# Patient Record
Sex: Female | Born: 1960 | Race: White | Hispanic: No | Marital: Married | Smoking: Current every day smoker
Health system: Southern US, Community
[De-identification: ages and names within clinical notes are randomized; demographics above are authoritative.]

## PROBLEM LIST (undated history)

## (undated) DIAGNOSIS — F32A Depression, unspecified: Secondary | ICD-10-CM

## (undated) DIAGNOSIS — I10 Essential (primary) hypertension: Secondary | ICD-10-CM

## (undated) DIAGNOSIS — E7800 Pure hypercholesterolemia, unspecified: Secondary | ICD-10-CM

## (undated) DIAGNOSIS — K7581 Nonalcoholic steatohepatitis (NASH): Secondary | ICD-10-CM

## (undated) DIAGNOSIS — M4300 Spondylolysis, site unspecified: Secondary | ICD-10-CM

## (undated) DIAGNOSIS — M5020 Other cervical disc displacement, unspecified cervical region: Secondary | ICD-10-CM

## (undated) DIAGNOSIS — I2510 Atherosclerotic heart disease of native coronary artery without angina pectoris: Secondary | ICD-10-CM

## (undated) DIAGNOSIS — M5126 Other intervertebral disc displacement, lumbar region: Secondary | ICD-10-CM

## (undated) DIAGNOSIS — F329 Major depressive disorder, single episode, unspecified: Secondary | ICD-10-CM

## (undated) HISTORY — PX: CHOLECYSTECTOMY: SHX55

## (undated) HISTORY — DX: Depression, unspecified: F32.A

## (undated) HISTORY — DX: Pure hypercholesterolemia, unspecified: E78.00

---

## 1997-04-24 ENCOUNTER — Emergency Department: Admit: 1997-04-24 | Payer: Self-pay | Admitting: Family Medicine

## 2002-05-04 ENCOUNTER — Ambulatory Visit: Admit: 2002-05-04 | Disposition: A | Discharge status: Home / Self Care | Payer: Self-pay | Source: Ambulatory Visit

## 2002-05-05 ENCOUNTER — Ambulatory Visit
Admit: 2002-05-05 | Disposition: A | Discharge status: Home / Self Care | Payer: Self-pay | Source: Ambulatory Visit | Admitting: Cardiovascular Disease

## 2008-08-28 ENCOUNTER — Emergency Department: Admit: 2008-08-28 | Payer: Self-pay | Source: Emergency Department | Admitting: Internal Medicine

## 2011-08-20 ENCOUNTER — Emergency Department: Payer: PRIVATE HEALTH INSURANCE

## 2011-08-20 ENCOUNTER — Emergency Department
Admission: EM | Admit: 2011-08-20 | Discharge: 2011-08-20 | Discharge status: Against Medical Advice | Payer: PRIVATE HEALTH INSURANCE | Attending: Emergency Medicine | Admitting: Emergency Medicine

## 2011-08-20 DIAGNOSIS — I10 Essential (primary) hypertension: Secondary | ICD-10-CM | POA: Insufficient documentation

## 2011-08-20 DIAGNOSIS — R079 Chest pain, unspecified: Secondary | ICD-10-CM

## 2011-08-20 DIAGNOSIS — R0789 Other chest pain: Secondary | ICD-10-CM | POA: Insufficient documentation

## 2011-08-20 HISTORY — DX: Essential (primary) hypertension: I10

## 2011-08-20 LAB — CBC AND DIFFERENTIAL
Basophils Absolute Automated: 0.04 10*3/uL (ref 0.00–0.20)
Basophils Automated: 0 % (ref 0–2)
Eosinophils Absolute Automated: 0.17 10*3/uL (ref 0.00–0.70)
Eosinophils Automated: 2 % (ref 0–5)
Hematocrit: 41.7 % (ref 37.0–47.0)
Hgb: 14.4 g/dL (ref 12.0–16.0)
Lymphocytes Absolute Automated: 3.14 10*3/uL (ref 0.50–4.40)
Lymphocytes Automated: 40 % (ref 15–41)
MCH: 34 pg — ABNORMAL HIGH (ref 28.0–32.0)
MCHC: 34.5 g/dL (ref 32.0–36.0)
MCV: 98.6 fL (ref 80.0–100.0)
MPV: 9 fL — ABNORMAL LOW (ref 9.4–12.3)
Monocytes Absolute Automated: 0.74 10*3/uL (ref 0.00–1.20)
Monocytes: 10 % (ref 0–11)
Neutrophils Absolute: 3.72 10*3/uL (ref 1.80–8.10)
Neutrophils: 48 % — ABNORMAL LOW (ref 52–75)
Platelets: 321 10*3/uL (ref 140–400)
RBC: 4.23 10*6/uL (ref 4.20–5.40)
RDW: 12 % (ref 12–15)
WBC: 7.81 10*3/uL (ref 3.50–10.80)

## 2011-08-20 LAB — HEPATIC FUNCTION PANEL
ALT: 35 U/L (ref 4–36)
AST (SGOT): 33 U/L (ref 10–41)
Albumin/Globulin Ratio: 1.5 (ref 1.1–1.8)
Albumin: 4.5 g/dL (ref 3.4–4.9)
Alkaline Phosphatase: 87 U/L (ref 43–112)
Bilirubin Direct: 0 mg/dL (ref 0.0–0.3)
Bilirubin Indirect: 0.1 mg/dL (ref 0.0–1.1)
Bilirubin, Total: 0.5 mg/dL (ref 0.2–1.0)
Globulin: 3.1 g/dL (ref 2.0–3.7)
Protein, Total: 7.6 g/dL (ref 6.0–8.0)

## 2011-08-20 LAB — BASIC METABOLIC PANEL
BUN: 10 mg/dL (ref 7–21)
CO2: 24 mEq/L (ref 22–31)
Calcium: 9.5 mg/dL (ref 8.6–10.2)
Chloride: 107 mEq/L (ref 98–107)
Creatinine: 0.6 mg/dL (ref 0.5–1.4)
Glucose: 92 mg/dL (ref 70–100)
Potassium: 4 mEq/L (ref 3.6–5.0)
Sodium: 143 mEq/L (ref 136–143)

## 2011-08-20 LAB — TROPONIN I: Troponin I: <0.05 ng/mL (ref 0.00–0.30)

## 2011-08-20 LAB — GFR: EGFR: >60

## 2011-08-20 MED ORDER — IOHEXOL 350 MG/ML IV SOLN
100.00 mL | Freq: Once | INTRAVENOUS | Status: AC
Start: 2011-08-20 — End: 2011-08-20
  Administered 2011-08-20: 120 mL via INTRAVENOUS

## 2011-08-20 MED ORDER — ASPIRIN 81 MG PO CHEW
162.00 mg | CHEWABLE_TABLET | Freq: Once | ORAL | Status: AC
Start: 2011-08-20 — End: 2011-08-20
  Administered 2011-08-20: 162 mg via ORAL
  Filled 2011-08-20: qty 2

## 2011-08-20 MED ORDER — NITROGLYCERIN 0.4 MG SL SUBL
0.4000 mg | SUBLINGUAL_TABLET | SUBLINGUAL | Status: DC | PRN
Start: 2011-08-20 — End: 2011-08-20
  Administered 2011-08-20 (×2): 0.4 mg via SUBLINGUAL
  Filled 2011-08-20: qty 1

## 2011-08-20 NOTE — Discharge Instructions (Signed)
Return to ER for further chest pain, trouble breathing, if willing to have more treatment/evaluation or other concerning symptoms.    Follow-up with a primary doctor and cardiologist as soon as possible (recommendations given).    Your doctor today was Dr. Bennett Scrape.  Thank you for the opportunity to care for you.

## 2011-08-20 NOTE — ED Provider Notes (Signed)
History     Chief Complaint   Patient presents with   . Chest Pain     HPI Comments: Sudden onset of severe chest pain earlier today.  Pain is been present constantly but waxing and waning, at presentation still severe.  No radiation of pain,  No movement over time.  Not worse with exertion or Deep breath.  Pain is sharp.  No associated diaphoresis, nausea, vomiting, does feel somewhat short of breath.   Patient has family history of aortic dissection (sister).  She does smoke, has high cholesterol and hypertension.    The history is provided by the patient.       Past Medical History   Diagnosis Date   . Hypertensive disorder        Past Surgical History   Procedure Date   . Cholecystectomy        Family History   Problem Relation Age of Onset   . Heart disease Mother    . Heart disease Father    . Heart disease Sister        Current Facility-Administered Medications   Medication Dose Route Frequency Provider Last Rate Last Dose   . aspirin chewable tablet 162 mg  162 mg Oral Once Pura Spice, MD   162 mg at 08/20/11 1916   . iohexol (OMNIPAQUE) 350 MG/ML injection 100 mL  100 mL Intravenous Once Pura Spice, MD   120 mL at 08/20/11 1714   . nitroglycerin (NITROSTAT) SL tablet 0.4 mg  0.4 mg Sublingual Q5 Min PRN Pura Spice, MD   0.4 mg at 08/20/11 1615     Current Outpatient Prescriptions   Medication Sig Dispense Refill   . ibuprofen (ADVIL,MOTRIN) 400 MG tablet Take 400 mg by mouth every 6 (six) hours as needed.             No Known Allergies    History   Substance Use Topics   . Smoking status: Current Some Day Smoker -- 1.0 packs/day for 35 years   . Smokeless tobacco: Not on file   . Alcohol Use: Yes      social       Review of Systems   Constitutional: Negative.    HENT: Negative for sore throat.    Eyes: Negative.    Respiratory: Positive for shortness of breath.    Cardiovascular: Positive for chest pain.   Gastrointestinal: Negative.    Genitourinary: Negative.    Musculoskeletal:  Negative for back pain.   Skin: Negative.    Neurological: Negative.    Hematological: Negative.        Physical Exam   BP 160/91  Pulse 69  Temp(Src) 99.1 F (37.3 C) (Oral)  Resp 14  Ht 1.575 m  Wt 49.896 kg  BMI 20.11 kg/m2  SpO2 99%    Physical Exam   Constitutional: She appears well-developed and well-nourished.        Uncomfortable appearing   HENT:   Mouth/Throat: Oropharynx is clear and moist.   Eyes: Conjunctivae are normal. No scleral icterus.   Cardiovascular: Normal rate, regular rhythm and normal heart sounds.         Pulses equal   Pulmonary/Chest: Effort normal and breath sounds normal. No respiratory distress.   Abdominal: Soft. There is no tenderness.        No masses   Musculoskeletal:        No back tenderness   Neurological: She is alert.  Motor grossly normal   Skin: Skin is warm and dry.   Psychiatric: She has a normal mood and affect. Her behavior is normal.       ED Course   Procedures    MDM  Number of Diagnoses or Management Options  Chest pain, unspecified:   Diagnosis management comments: DDx includes ACS, PE, dissection, PNA, PTX, pericarditis, musculoskeletal, upper abdominal pain, etc.    This case, concern for PE, Dissection, ACS still present after initial testing.    Chest x-ray: no infiltrate  Labs: negative cardiac enzymes, Normal renal function  CT chest: no evidence of PE    Patient received to nitroglycerin and became chest pain free.    In light of clinical picture, patient advised to be admitted to the hospital for further workup, but after review of risks and benefits patient declined, choosing to leave against medical advice.  Patient demonstrates capacity to make this decision.    Return precautions, supportive care, follow-up recommendations reviewed.           EKG showed: NSR at 92, slightly peaked t-waves.  Repeat EKG at conclusion of visit NSR at 89, morphology unchanged from prior.    Pura Spice, MD  08/22/11 [phone]

## 2011-08-20 NOTE — ED Notes (Addendum)
51 y.o female with L chest pain 1000 am 10/10- constant now still there 4/10 now. Radiated under L breast.had been carrying wood in house prior(but does this usually).

## 2011-08-21 LAB — ECG 12-LEAD
Atrial Rate: 92 {beats}/min
Atrial Rate: 89 {beats}/min
P Axis: 54 degrees
P Axis: 59 degrees
P-R Interval: 116 ms
P-R Interval: 120 ms
Q-T Interval: 364 ms
Q-T Interval: 376 ms
QRS Duration: 88 ms
QRS Duration: 84 ms
QTC Calculation (Bezet): 450 ms
QTC Calculation (Bezet): 457 ms
R Axis: 54 degrees
R Axis: 49 degrees
T Axis: 59 degrees
T Axis: 55 degrees
Ventricular Rate: 92 {beats}/min
Ventricular Rate: 89 {beats}/min

## 2013-01-16 ENCOUNTER — Emergency Department: Payer: PRIVATE HEALTH INSURANCE

## 2013-01-16 ENCOUNTER — Emergency Department
Admission: EM | Admit: 2013-01-16 | Discharge: 2013-01-16 | Disposition: A | Discharge status: Home / Self Care | Payer: Charity | Attending: Emergency Medicine | Admitting: Emergency Medicine

## 2013-01-16 DIAGNOSIS — F17200 Nicotine dependence, unspecified, uncomplicated: Secondary | ICD-10-CM | POA: Insufficient documentation

## 2013-01-16 DIAGNOSIS — I10 Essential (primary) hypertension: Secondary | ICD-10-CM | POA: Insufficient documentation

## 2013-01-16 DIAGNOSIS — B9789 Other viral agents as the cause of diseases classified elsewhere: Secondary | ICD-10-CM | POA: Insufficient documentation

## 2013-01-16 DIAGNOSIS — E7800 Pure hypercholesterolemia, unspecified: Secondary | ICD-10-CM | POA: Insufficient documentation

## 2013-01-16 DIAGNOSIS — J029 Acute pharyngitis, unspecified: Secondary | ICD-10-CM | POA: Insufficient documentation

## 2013-01-16 MED ORDER — NAPROXEN 250 MG PO TABS
500.0000 mg | ORAL_TABLET | Freq: Two times a day (BID) | ORAL | Status: AC
Start: 2013-01-16 — End: 2013-01-21

## 2013-01-16 MED ORDER — OXYMETAZOLINE HCL 0.05 % NA SOLN
2.0000 | Freq: Two times a day (BID) | NASAL | Status: DC
Start: 2013-01-16 — End: 2013-10-07

## 2013-01-16 NOTE — ED Notes (Signed)
Pt to ED with c/o throat pain and R ear pain x47month. Speaks clearly in complete sentences. A+0x4.

## 2013-01-16 NOTE — ED Notes (Signed)
Pt cleared for d/c to home. Pt angry and requesting antibiotics. Dr. Harvie Junior spoke with pt, informed pt based on his clinical findings of no swelling or redness in ear, and no redness or sign of infection in back of throat, abx not indicated at this time. Pt angry states she does not want her rxs for afrin and naproxen and that "they will not help." Pt stated she will go somewhere else to get antibiotics. Discharge instructions provided to pt and 2 rx's. Pt verbalized understanding. A+0x4. Ambulated out of ED with steady gait. No increased work of breathing noted.

## 2013-01-16 NOTE — Discharge Instructions (Signed)
Pharyngitis, Viral     You have been diagnosed with viral pharyngitis. This is the medical term for a sore throat.     Viral pharyngitis is an infection in the back of your throat and tonsils caused by a virus. It may look and feel like a Strep throat, but it usually occurs with other cold symptoms like a runny nose, sinus congestion, cough, fever, or muscle aches. It is sometimes hard to tell a viral sore throat from Strep throat. Your physician may do a "rapid strep test" or culture to see which kind of infection you have.     Symptoms include fever, sore throat, painful swallowing, and headache. You may also have symptoms of the common cold and notice swollen tender neck glands. You might have pus or white spots on your tonsils.     Viral pharyngitis is treated with medication for pain and fever. Antibiotics DO NOT cure viral infections and may cause side-effects, like diarrhea, nausea, abdominal cramps or allergic reactions. Using antibiotics when they are not necessary can lead to resistance, meaning antibiotics will not work when you need them in the future.     YOU SHOULD SEEK MEDICAL ATTENTION IMMEDIATELY, EITHER HERE OR AT THE NEAREST EMERGENCY DEPARTMENT, IF ANY OF THE FOLLOWING OCCURS:  · You have trouble breathing.  · Your voice changes or sounds hoarse.  · Your throat pain gets worse or you have neck pain.  · You cannot swallow liquids or medication.  · You drool or cannot swallow saliva.  · You feel worse or don’t improve after 2 to 3 days.        Decongestant Use (EDU)     A decongestant is a medicine that helps to dry up a runny nose. It relieves the stuffy feeling that usually goes along with a cold. It is also used to treat congestion caused by allergies, hay fever, or sinus irritation. Decongestants work by shrinking the blood vessels in your nose for a few hours. There are two main types of decongestants: Oral medications (the kind you swallow) and nasal sprays.     Pseudoephedrine is a common  oral decongestant that you can buy over-the-counter. Sudafed is a well-known brand of pseudoephedrine.     Phenylephrine is another common oral decongestant that you can buy over the counter. Sudafed PE is a well-known brand of oral phenylephrine.     Oral decongestants have some side-effects. They can make you dizzy or sleepy. Some people may become agitated or restless. Be careful when driving until you know how the medicine affects you. Because the medicine moves around your blood stream, it can constrict blood vessels other than the ones in your nose. This can cause your blood pressure to go up. Talk to your doctor before taking decongestants if you have any of the following:   · High blood pressure.  · Heart disease.  · Problems urinating or an enlarged prostate.  · Diabetes.  · Thyroid problems.  · Glaucoma.     If you are pregnant or breast-feeding, talk to your OB/GYN before taking any over-the-counter medicines. The American College of Obstetricians and Gynecologists (ACOG) recommends that pregnant women do not take pseudoephedrine or phenylephrine during the first trimester (weeks one to 13). These medications are generally considered safe later in pregnancy.     The American Academy of Pediatrics and the Working Group on Human Lactation say it is safe to use pseudoephedrine occasionally while breastfeeding. Phenylephrine is safe to use while breastfeeding.     If you try a decongestant medicine and your   congestion does not get better, talk to your regular doctor or a doctor who specializes in ear, nose, and throat disorders.

## 2013-01-16 NOTE — ED Provider Notes (Signed)
Physician/Midlevel provider first contact with patient: 01/16/13 1938         History     Chief Complaint   Patient presents with   . Sore Throat     right ear pain        Kari Hebert is a 52 y.o. female who p/w sore throat and R ear pain for 1 month. Pt has tried over the counter remedies with no relief to symptoms. Her daughter also has similar concerns. She denies any other concerns. No fever. No cough.    PCP: Christa See, MD           Past Medical History   Diagnosis Date   . Hypertensive disorder    . Hypercholesteremia        Past Surgical History   Procedure Date   . Cholecystectomy        Family History   Problem Relation Age of Onset   . Heart disease Mother    . Heart disease Father    . Heart disease Sister        Social  History   Substance Use Topics   . Smoking status: Current Some Day Smoker -- 1.0 packs/day for 35 years   . Smokeless tobacco: Not on file   . Alcohol Use: Yes      Comment: social       .     No Known Allergies    Current/Home Medications    IBUPROFEN (ADVIL,MOTRIN) 400 MG TABLET    Take 400 mg by mouth every 6 (six) hours as needed.          Review of Systems   Constitutional: Negative for fever.   All other systems reviewed and are negative.        Physical Exam    BP 106/96  Pulse 83  Temp 98 F (36.7 C)  Resp 18  Ht 1.6 m  Wt 54.432 kg  BMI 21.26 kg/m2  SpO2 100%    Physical Exam   Nursing note and vitals reviewed.    BP 106/96  Pulse 83  Temp 98 F (36.7 C)  Resp 18  Ht 1.6 m  Wt 54.432 kg  BMI 21.26 kg/m2  SpO2 100%    Constitutional: Oriented to person, place and time. Comfortable.  Head: Atraumatic.  Eyes: No conjunctival injection. No discharge.  ION:[id number] secretions.  No hoarseness.  TM's normal appearing bilaterally. No meningeal signs. No mastoid tenderness b/l.  Posterior pharynx erythematous, without tonsillar exudates or uvular edema.  No stridor. No drooling.    Neck: Normal range of motion. Non-tender.   Respiratory/Chest: Clear to  auscultation. No retractions.  Cardiovascular: Regular rhythm.  Capillary refill < 2 seconds in all extremities.   Abdomen: Soft. Non-tender.   Genitourinary:   Upper Extremity: No edema.   Back:  No CVA tenderness.  No midline spine tenderness.    Lower Extremity: No edema.  No calf tenderness.  Neurological: Symmetric strength and sensation in bilateral arms and legs.  Skin: Warm and dry. No rash.  Lymphatic: + anterior cervical lymphadenopathy.  Psychiatric: Normal affect.           MDM and ED Course     ED Medication Orders     None           MDM  Number of Diagnoses or Management Options  Viral pharyngitis: new and requires workup  Diagnosis management comments:     52 y.o. Female p/w sore throat  and right ear pain as described above.  Afebrile. Airway patent.  No stridor, trismus, drooling or other evidence of ludwig's or epiglottitis. Will d/c as a viral process. Return instructions given. Pt a/w plan.     Risk of Complications, Morbidity, and/or Mortality  Presenting problems: moderate  Diagnostic procedures: moderate  Management options: moderate    Patient Progress  Patient progress: stable        Procedures    Clinical Impression & Disposition     Clinical Impression  Final diagnoses:   Viral pharyngitis        ED Disposition     Discharge Kari Hebert discharge to home/self care.    Condition at discharge: Good             New Prescriptions    NAPROXEN (NAPROSYN) 250 MG TABLET    Take 2 tablets (500 mg total) by mouth 2 (two) times daily with meals.    OXYMETAZOLINE (AFRIN) 0.05 % NASAL SPRAY    2 sprays by Nasal route 2 (two) times daily.        Treatment Team: Scribe: Wijegunawardena, Neluka    I was acting as a Neurosurgeon for Delorse Lek, MD on [employer] E  Treatment Team: Scribe: Wijegunawardena, Neluka   I am the first provider for this patient and I personally performed the services documented. Treatment Team: Scribe: Wijegunawardena, Neluka is scribing for me on [place] E. This note accurately  reflects work and decisions made by me.  Delorse Lek, MD        Delorse Lek, MD  01/16/13 [phone]

## 2013-10-07 ENCOUNTER — Emergency Department: Payer: PRIVATE HEALTH INSURANCE

## 2013-10-07 ENCOUNTER — Emergency Department
Admission: EM | Admit: 2013-10-07 | Discharge: 2013-10-07 | Disposition: A | Discharge status: Home / Self Care | Payer: PRIVATE HEALTH INSURANCE | Attending: Emergency Medicine | Admitting: Emergency Medicine

## 2013-10-07 DIAGNOSIS — F17200 Nicotine dependence, unspecified, uncomplicated: Secondary | ICD-10-CM | POA: Insufficient documentation

## 2013-10-07 DIAGNOSIS — X500XXA Overexertion from strenuous movement or load, initial encounter: Secondary | ICD-10-CM | POA: Insufficient documentation

## 2013-10-07 DIAGNOSIS — Z79899 Other long term (current) drug therapy: Secondary | ICD-10-CM | POA: Insufficient documentation

## 2013-10-07 DIAGNOSIS — S93401A Sprain of unspecified ligament of right ankle, initial encounter: Secondary | ICD-10-CM

## 2013-10-07 DIAGNOSIS — S93409A Sprain of unspecified ligament of unspecified ankle, initial encounter: Secondary | ICD-10-CM | POA: Insufficient documentation

## 2013-10-07 DIAGNOSIS — I10 Essential (primary) hypertension: Secondary | ICD-10-CM | POA: Insufficient documentation

## 2013-10-07 DIAGNOSIS — F3289 Other specified depressive episodes: Secondary | ICD-10-CM | POA: Insufficient documentation

## 2013-10-07 DIAGNOSIS — E7800 Pure hypercholesterolemia, unspecified: Secondary | ICD-10-CM | POA: Insufficient documentation

## 2013-10-07 NOTE — Discharge Instructions (Signed)
Ankle Sprain     You have been diagnosed with an ankle sprain.     A sprain is a ligament injury, usually a tear or partial tear. Sprains can hurt as much as broken bones. Sprains can be classified by the degree of injury. A first-degree sprain is considered a minor tear. A second-degree sprain is a partial tear of the ligament. A third-degree sprain often involves a small fracture, or break, of the bone that the ligament is attached to.     Sprains are usually treated with pain medication and a splint to keep the joint from moving. You should Rest, Ice, Compress, and Elevate the injured ankle. Remember this as "RICE."  · REST: Limit the use of the injured body part.  · ICE: By applying ice to the affected area, swelling and pain can be reduced. Place some ice cubes in a re-sealable (Ziploc) bag and add some water. Put a thin washcloth between the bag and the skin. Apply the ice bag to the area for at least 20 minutes. Do this at least 4 times per day. Using the ice for longer times and more frequently is OK. NEVER APPLY ICE DIRECTLY TO THE SKIN.  · COMPRESS: Compression means to apply pressure around the injured area such as with a splint, cast or an ace bandage. Compression decreases swelling and improves comfort. Compression should be tight enough to relieve swelling but not so tight as to decrease circulation. Increasing pain, numbness, tingling, or change in skin color, are all signs of decreased circulation.  · ELEVATE: Elevate the injured part. For example, a sprained ankle can be placed up on a chair while sitting and propped up on pillows while lying down.     You have been given an AIR/GEL SPLINT to use. For the next 2 weeks, wear the splint all the time except when you sleep or take a bath. After 2 weeks, keep using the splint when you play sports, run, hike, walk on uneven ground, or do any activity that might injure your ankle.     Ankle exercises are described below. Begin the exercises as soon as you  are able. They will make the ankle stronger to prevent new injuries. Do the exercises 5 to 10 times each day.  · Use your big toe to draw out the letters of the alphabet on the ground. Move your ankle as you make each letter.  · Sit with your leg straight out in front of you. Wrap a towel around the ball of your foot (just below your toes) and pull back. Pull hard enough to stretch the ankle. Don’t pull hard enough to cause pain. Hold the stretch for 30 seconds.  · Stand up. Rock onto the tiptoes of the injured foot and then return to the flat position. Repeat 10 times.  · Rotate your ankle in a circle. Make 10 clockwise circles, then make 10 circles going the other way.     YOU SHOULD SEEK MEDICAL ATTENTION IMMEDIATELY, EITHER HERE OR AT THE NEAREST EMERGENCY DEPARTMENT, IF ANY OF THE FOLLOWING OCCURS:  · Your pain gets much worse.  · Your ankle or foot starts to tingle or it becomes numb.  · Your foot is cold or pale. This might mean there is a problem with circulation (blood supply).

## 2013-10-07 NOTE — ED Provider Notes (Signed)
Date: 10/07/2013  Patient Name: Kari Hebert  Attending Physician: Kari Billings, MD    Attending Note  I have reviewed and agree with history. The pertinent physical exam has been documented. The patient was seen and examined by the  resident, Kari Sale, MD. The plan of care was discussed with me.     I saw and examined this patient myself.     DISPOSITION AND TREATMENT PLAN     Final diagnoses:   Ankle sprain, right, initial encounter      ED Disposition     Discharge Kari Hebert discharge to home/self care.    Condition at disposition: Stable              Medical Decision Making and Clinical Course    Kari Hebert is a 53 y.o. female with ankle injury.  N/v intact.  Xray, reassess.      Differential Diagnosis (not completely inclusive): sprain, fracture.     On Kari Hebert:   No fracture noted on xray.  Ace + air splint.  F/u with podiatry as needed.  Return precautions given.     New Prescriptions    No medications on file          MEDICATIONS GIVEN IN THE EMERGENCY DEPARTMENT       Medications   atenolol (TENORMIN) 25 MG tablet (not administered)   PAROXETINE HCL PO (not administered)       CLINICAL INFORMATION     Selected historical findings: 53 y.o.female with h/o HTN, HLD, depression p/w R ankle injury with assoc swelling + pain onset about 1 hour PTA. Pt reports she was going down some stairs when she rolled her R ankle. Did not hear a "pop". Pt is able to ambulate, but pain is worse with movement. No other injury.    History obtained from: Patient.    Listed Medications on Arrival:  Previous Medications    ATENOLOL (TENORMIN) 25 MG TABLET    Take 25 mg by mouth 2 (two) times daily.    PAROXETINE HCL PO    Take by mouth daily.           Physical Examination:  Pulse 78   BP 158/75 mmHg  Resp 16   SpO2 100 %  Temp 97.6 F (36.4 C)    CONSTITUTIONAL: Vital signs reviewed, Well appearing, Patient appears comfortable, Alert and oriented X 3.   RESP:  No respiratory distress.   CARDIOVASCULAR: 2+ pulses in  all ext   LOWER EXTREMITY: No cyanosis, No edema, Normal range of motion. Ecchymosis to L anterior tibia. Swelling + ttp to R lateral malleolus. FROM to toes. Normal cap refill. No ttp to fifth metatarsal.   NEURO: No focal motor/sens deficits, Speech normal.              PAST HISTORY     Medical History  Past Medical History   Diagnosis Date   . Hypertensive disorder    . Hypercholesteremia    . Depression        Surgical History  Past Surgical History   Procedure Date   . Cholecystectomy        Family History  Family History   Problem Relation Age of Onset   . Heart disease Mother    . Heart disease Father    . Heart disease Sister        Social History  History     Social History   . Marital Status: Single  Spouse Name: N/A     Number of Children: N/A   . Years of Education: N/A     Occupational History   . Not on file.     Social History Main Topics   . Smoking status: Current Some Day Smoker -- 1.0 packs/day for 35 years     Types: Cigarettes   . Smokeless tobacco: Not on file   . Alcohol Use: Yes      Comment: socially   . Drug Use: No   . Sexually Active: Not on file     Other Topics Concern   . Not on file     Social History Narrative   . No narrative on file       REVIEW OF SYSTEMS     Pertinent Positives and Negatives noted in the HPI.    MEDICAL DECISION MAKING       EKG was Reviewed, Analyzed and Interpreted by me: N/A     Rhythm Strip Interpretation / Cardiac Monitor Analysis: N/A    Pulse Ox Analysis: Yes: saturation: 100 %; Oxygen use: room air; Interpretation: Normal      Critical Care Time: No Critical Care Time     Splint -         Air splint, placed by the tech.  After placement the extremity was neurovascularly intact.          _______________________________    Note Attestation  I was acting as a Neurosurgeon for Kari Babinski, MD on [place] E  Treatment Team: Scribe: Ardis Rowan     I am the first provider for this patient and I personally performed the services documented. Treatment Team:  Scribe: Ardis Rowan is scribing for me on [place] E. This note accurately reflects work and decisions made by me.  Kari Babinski, MD            Kari Babinski, MD  10/07/13 [phone]

## 2013-10-07 NOTE — ED Notes (Signed)
C/O right ankle injury from trip on stairs, occurred 1 hour ago, arrives ambulatory, pain increases with movement, no bruising/swelling noted, neurovascular intact

## 2013-10-07 NOTE — ED Provider Notes (Signed)
Physician/Midlevel provider first contact with patient: 10/07/13 1610         History     Chief Complaint   Patient presents with   . Ankle Injury     Patient is a 53 y.o. female presenting with lower extremity injury.   Ankle Injury  Associated symptoms include joint swelling. Pertinent negatives include no chest pain, chills, congestion, coughing, fever, headaches, myalgias, nausea, neck pain, numbness, vomiting or weakness.       53 yo F who presents with a R ankle injury which occurred 1 hour ago.  She was walking down the stairs when she rolled her ankle (inversion injury).  She did not hear a pop and was able to bear weight immediately after the injury.  She denies numbness/tingling in her extremities, fever/chills.  She has not taken anything for pain.      Past Medical History   Diagnosis Date   . Hypertensive disorder    . Hypercholesteremia    . Depression        Past Surgical History   Procedure Date   . Cholecystectomy        Family History   Problem Relation Age of Onset   . Heart disease Mother    . Heart disease Father    . Heart disease Sister        Social  History   Substance Use Topics   . Smoking status: Current Some Day Smoker -- 1.0 packs/day for 35 years     Types: Cigarettes   . Smokeless tobacco: Not on file   . Alcohol Use: Yes      Comment: socially       .     No Known Allergies    Current/Home Medications    ATENOLOL (TENORMIN) 25 MG TABLET    Take 25 mg by mouth 2 (two) times daily.    PAROXETINE HCL PO    Take by mouth daily.        Review of Systems   Constitutional: Negative for fever and chills.   HENT: Negative for congestion.    Respiratory: Negative for cough and shortness of breath.    Cardiovascular: Negative for chest pain and leg swelling.   Gastrointestinal: Negative for nausea, vomiting and diarrhea.   Musculoskeletal: Positive for gait problem and joint swelling. Negative for back pain, myalgias, neck pain and neck stiffness.   Skin: Negative for pallor.   Neurological:  Negative for dizziness, weakness, light-headedness, numbness and headaches.       Physical Exam    BP: 158/75 mmHg, Heart Rate: 78 , Temp: 97.6 F (36.4 C), Resp Rate: 16 , SpO2: 100 %, Weight: 54.432 kg    Physical Exam   Constitutional: She appears well-developed and well-nourished.   HENT:   Head: Normocephalic and atraumatic.   Eyes: EOM are normal. Pupils are equal, round, and reactive to light.   Neck: Normal range of motion.   Cardiovascular: Normal rate, regular rhythm and normal heart sounds.  Exam reveals no gallop and no friction rub.    No murmur heard.  Pulmonary/Chest: Effort normal and breath sounds normal. No respiratory distress. She has no wheezes.   Musculoskeletal:        Active ROM limited in the R ankle due to pain.  Pt able to move toes of R foot.  Mild swelling around the R lateral malleolus.  Mild ttp of the lateral R foot.  No apparent R achilles or R calcaneal injury.  Full ROM and 5/5 strength at b/l knees and hips.        MDM and ED Course     ED Medication Orders     None           MDM  Number of Diagnoses or Management Options  Ankle sprain, right, initial encounter:   Diagnosis management comments: Xr Ankle Right 3+ Views    10/07/2013   Normal study.    Genelle Bal, MD  10/07/2013 4:25 PM      Foot Right Ap Lateral And Oblique    10/07/2013   Normal study.    Genelle Bal, MD  10/07/2013 4:28 PM        MDM: Cathlean Sauer of the R ankle and R foot are not concerning for fracture.  Diagnosis is likely a R ankle sprain.  Will place an air splint and have the patient follow up with podiatry as needed.             Procedures    Clinical Impression & Disposition     Clinical Impression  Final diagnoses:   Ankle sprain, right, initial encounter        ED Disposition     Discharge GORDIE BELVIN discharge to home/self care.    Condition at disposition: Stable             New Prescriptions    No medications on file        Treatment Team: Scribe: Joya Gaskins,  MD  Resident  10/07/13 1739

## 2015-12-19 ENCOUNTER — Emergency Department: Payer: No Typology Code available for payment source

## 2015-12-19 ENCOUNTER — Emergency Department
Admission: EM | Admit: 2015-12-19 | Discharge: 2015-12-19 | Disposition: A | Discharge status: Home / Self Care | Payer: No Typology Code available for payment source | Attending: Emergency Medicine | Admitting: Emergency Medicine

## 2015-12-19 DIAGNOSIS — J069 Acute upper respiratory infection, unspecified: Secondary | ICD-10-CM

## 2015-12-19 DIAGNOSIS — J40 Bronchitis, not specified as acute or chronic: Secondary | ICD-10-CM | POA: Insufficient documentation

## 2015-12-19 DIAGNOSIS — E7800 Pure hypercholesterolemia, unspecified: Secondary | ICD-10-CM | POA: Insufficient documentation

## 2015-12-19 DIAGNOSIS — Z79899 Other long term (current) drug therapy: Secondary | ICD-10-CM | POA: Insufficient documentation

## 2015-12-19 DIAGNOSIS — Z9049 Acquired absence of other specified parts of digestive tract: Secondary | ICD-10-CM | POA: Insufficient documentation

## 2015-12-19 DIAGNOSIS — H9201 Otalgia, right ear: Secondary | ICD-10-CM | POA: Insufficient documentation

## 2015-12-19 DIAGNOSIS — I10 Essential (primary) hypertension: Secondary | ICD-10-CM | POA: Insufficient documentation

## 2015-12-19 DIAGNOSIS — F17210 Nicotine dependence, cigarettes, uncomplicated: Secondary | ICD-10-CM | POA: Insufficient documentation

## 2015-12-19 LAB — GROUP A STREP, RAPID ANTIGEN: Group A Strep, Rapid Antigen: NEGATIVE

## 2015-12-19 MED ORDER — ALBUTEROL SULFATE HFA 108 (90 BASE) MCG/ACT IN AERS
2.0000 | INHALATION_SPRAY | RESPIRATORY_TRACT | Status: AC | PRN
Start: 2015-12-19 — End: 2016-01-18

## 2015-12-19 MED ORDER — AZITHROMYCIN 250 MG PO TABS
500.0000 mg | ORAL_TABLET | Freq: Once | ORAL | Status: AC
Start: 2015-12-19 — End: 2015-12-19
  Administered 2015-12-19: 500 mg via ORAL
  Filled 2015-12-19: qty 2

## 2015-12-19 MED ORDER — IBUPROFEN 600 MG PO TABS
600.0000 mg | ORAL_TABLET | Freq: Once | ORAL | Status: AC
Start: 2015-12-19 — End: 2015-12-19
  Administered 2015-12-19: 600 mg via ORAL
  Filled 2015-12-19: qty 1

## 2015-12-19 MED ORDER — AZITHROMYCIN 250 MG PO TABS
250.0000 mg | ORAL_TABLET | Freq: Every day | ORAL | Status: AC
Start: 2015-12-19 — End: 2015-12-23

## 2015-12-19 MED ORDER — ALBUTEROL SULFATE (2.5 MG/3ML) 0.083% IN NEBU
2.5000 mg | INHALATION_SOLUTION | Freq: Once | RESPIRATORY_TRACT | Status: AC
Start: 2015-12-19 — End: 2015-12-19
  Administered 2015-12-19: 2.5 mg via RESPIRATORY_TRACT
  Filled 2015-12-19: qty 3

## 2015-12-19 NOTE — ED Notes (Signed)
Pt. C/o R ear pain and chest congestion with yellow-green productive mucous x2 wks. Pt. Also c/o vomiting and lower abd cramping as well. Last emesis was about 4 days ago. Pt. Denies fevers. Pt. Took percocet around 3 hrs ago.

## 2015-12-19 NOTE — Discharge Instructions (Signed)
Dear Kari Hebert:    Thank you for choosing one of [facility] emergency departments.  I hope your visit today was EXCELLENT.    Specific instructions for your visit today:    1. Pain/Fever:  Ibuprofen (motrin/advil) 600mg  every 6 hours with food for pain or fever.    Acetaminophen (tylenol) up to 3 grams a day for pain or fever.    2. Over the counter saline sinus rinse to help clear out nasal secretions (example, NeilMed squeeze bottle and packets).      3. Oral decongestants such as pseudoephedrine (ie Sudafed, and found BEHIND pharmacy counter), phenylephrine (ie Sudafed PE, Mucinex) to help with nasal congestion.    4. Topical decongestants (nasal sprays) may also help such as oxymetazoline or phenylephrine (ie Afrin, Vicks Nasal Spray, Mucinex) but should only be used for short durations (2-3 days) as it can make symptoms worse if used for longer periods of time.     5. Cough suppressants such as dextromethorphan (ie Delsym, Robitussin, Zicam) may be helpful as well. Use the albuterol inhaler to also help with cough.     6. Sore throat may be improved by gargling warm salt water and/or with lozenges and anesthetic phenol sprays.     7. Use the antibiotic (azithromycin) to treat a possible bacterial cause of your symptoms (bronchitis).   --------------------------------------------------------------------------------------------------------------------  Drink plenty of water to stay hydrated. None of the above medications will be beneficial without drinking plenty of water.     Using a humidifier in your house or sleeping room may help as well.     Follow up with your primary doctor if not improving in 3-4 days.   Return to the ER for worsening shortness of breath, high fevers, or other concerns.        If you do not continue to improve or your condition worsens, please contact your doctor or return immediately to the Emergency Department.    Sincerely,  Koleen Distance, MD  Attending  Emergency Physician  [facility] Emergency Department    OBTAINING A PRIMARY CARE APPOINTMENT    Primary care physicians (PCPs, also known as primary care doctors) are either internists or family medicine doctors. Both types of PCPs focus on health promotion, disease prevention, patient education and counseling, and treatment of acute and chronic medical conditions.    Call for an appointment with a primary care doctor.  Ask to see who is taking new patients.     [facility]  telephone:  [phone]  [email or web address]    For a pediatrician, call the [facility] referral line below.  You can also call to make an appointment at [facility] (except [employer] and [facility]):    [address]  [place], [place] [address]  [phone]    [facility]  Call [phone] (available 24 hours a day, 7 days a week) if you need any further referrals and we can help you find a primary care doctor or specialist.  Also, available online at:  [email or web address]    For more information regarding our services at [facility], please call the number above or visit the website [email or web address]    YOUR CONTACT INFORMATION  Before leaving please check with registration to make sure we have an up-to-date contact number.  You can call registration at [phone], Option 7 [facility] location) or [phone], Option 1 [facility] location) to update your information.  For questions about your  hospital bill, please call [phone].  For questions about your Emergency Dept Physician bill please call [phone].      FREE HEALTH SERVICES  If you need help with health or social services, please call 2-1-1 for a free referral to resources in your area.  2-1-1 is a free service connecting people with information on health insurance, free clinics, pregnancy, mental health, dental care, food assistance, housing, and substance abuse  counseling.  Also, available online at:  http://www.[facility].org    MEDICAL RECORDS AND TESTS  Certain laboratory test results do not come back the same day, for example urine cultures.   We will contact you if other important findings are noted.  Radiology films are often reviewed again to ensure accuracy.  If there is any discrepancy, we will notify you.      Please call [phone] [facility] location) or [phone] ([place] location) to pick up a complimentary CD of any radiology studies performed.  If you or your doctor would like to request a copy of your medical records, please call [phone].      ORTHOPEDIC INJURY   Please know that significant injuries can exist even when an initial x-ray is read as normal or negative.  This can occur because some fractures (broken bones) are not initially visible on x-rays.  For this reason, close outpatient follow-up with your primary care doctor or bone specialist (orthopedist) is required.    MEDICATIONS AND FOLLOWUP  Please be aware that some prescription medications can cause drowsiness.  Use caution when driving or operating machinery.    The examination and treatment you have received in our Emergency Department is provided on an emergency basis, and is not intended to be a substitute for your primary care physician.  It is important that your doctor checks you again and that you report any new or remaining problems at that time.      24 HOUR PHARMACIES  Two nearby 24 hour pharmacies are:    [facility]  [address]  [place], [place] [address]  [phone]    [facility]  [address]  [place], [place] [address]  [phone]      ASSISTANCE WITH INSURANCE    Affordable Care Act  [facility])  Call to start or finish an application, compare plans, enroll or ask a question.  [phone]  TTY: [phone]  Web:  Healthcare.gov    Help Enrolling in [facility]  [employer] [place]  [phone] (TOLL-FREE)  [phone] (TTY)  Web:   Http://www.coverva.org    Local Help Enrolling in the [facility]  [place] [facility]  [phone] (MAIN)  Email:  health-help@nvfs .org  Web:  BlackjackMyths.is  Address:  [address] [place], [place] [address]    SEDATING MEDICATIONS  Sedating medications include strong pain medications (e.g. narcotics), muscle relaxers, benzodiazepines (used for anxiety and as muscle relaxers), Benadryl/diphenhydramine and other antihistamines for allergic reactions/itching, and other medications.  If you are unsure if you have received a sedating medication, please ask your physician or nurse.  If you received a sedating medication: DO NOT drive a car. DO NOT operate machinery. DO NOT perform jobs where you need to be alert.  DO NOT drink alcoholic beverages while taking this medicine.     If you get dizzy, sit or lie down at the first signs. Be careful going up and down stairs.  Be extra careful to prevent falls.     Never give this medicine to others.  Keep this medicine out of reach of children.     Do not take or save old medicines. Throw them away when outdated.     Keep all medicines in a cool, dry place. DO NOT keep them in your bathroom medicine cabinet or in a cabinet above the stove.    MEDICATION REFILLS  Please be aware that we cannot refill any prescriptions through the ER. If you need further treatment from what is provided at your ER visit, please follow up with your primary care doctor or your pain management specialist.      Bronchitis    You have been diagnosed with bronchitis.    Bronchitis is an irritation of the large breathing tubes. It can be caused by tobacco smoke, air pollution, or an infection. Patients with bronchitis are short of breath and may cough up green or yellow mucous. These symptoms are usually worse at night, when lying flat and in wet weather. Most people with bronchitis do not need antibiotics. If your doctor prescribes antibiotics, fill the prescription and  take all the medicine according to the instructions.    Bronchitis is usually treated with medicine to help stop coughing. An inhaler with albuterol (Ventolin/Proventil) is sometimes used to help with cough. It is best to use the inhaler with a spacer to help the medicine reach your lungs. The doctor can prescribe a spacer.    Bronchitis is usually caused by a virus. Antibiotics do not kill viruses. In fact, antibiotics do not affect viruses in any way. In the past, some doctors prescribed antibiotics for people with bronchitis. We now know that antibiotics are not helpful for most bronchitis patients. Patients who might need antibiotics are those with lung problems that don't go away, like emphysema or COPD.    Your coughing and wheezing might last for 2 or 3 weeks! The symptoms should get better over this time period and not worse.    Your doctor prescribed an albuterol (Ventolin/Proventil) inhaler. Use it every four hours if you are wheezing, coughing, or short of breath. This will reduce symptoms.    Do not smoke. Research shows that smoking causes heart disease, cancer, and birth defects. Avoiding smoking will help your asthma. If you smoke, ask your doctor for ideas about how to stop.   If you do not smoke, avoid others who do.    YOU SHOULD SEEK MEDICAL ATTENTION IMMEDIATELY, EITHER HERE OR AT THE NEAREST EMERGENCY DEPARTMENT, IF ANY OF THE FOLLOWING OCCURS:   You wheeze or have trouble breathing.   You have a fever (temperature higher than 100.61F / 38C), that won't go away.   You have chest pain.   You vomit or cannot keep liquids down or you feel weak or dizzy.   Your symptoms get worse over the next 2 or 3 days.

## 2015-12-19 NOTE — ED Provider Notes (Addendum)
[facility]      Visit date: 12/19/2015      CLINICAL SUMMARY          Diagnosis:    .     Final diagnoses:   Bronchitis   Upper respiratory tract infection, unspecified type   Otalgia of right ear         MDM Notes:      74F with bronchitis/URI symptoms x 2 weeks, CXR no pna, rapid strep neg, given albuterol for cough, will tx with azithromycin due to duration and continued cough as well as right ear effusion, safe for discharge.          Disposition:         Discharge         Discharge Prescriptions     Medication Sig Dispense Auth. Provider    albuterol (PROVENTIL HFA;VENTOLIN HFA) 108 (90 Base) MCG/ACT inhaler Inhale 2 puffs into the lungs every 4 (four) hours as needed for Wheezing or Shortness of Breath (coughing). Dispense with spacer 1 Inhaler Koleen Distance, MD    azithromycin (ZITHROMAX) 250 MG tablet Take 1 tablet (250 mg total) by mouth daily. 4 tablet Koleen Distance, MD                      CLINICAL INFORMATION        HPI:      Chief Complaint: Otalgia; Congestion; and Abdominal Pain  .    Kari Hebert is a 55 y.o. female h/o HTN, smoker, HL, who presents with cough x 2 weeks. Productive of yellow sputum. Also with right ear pain which newly developed. Had initially had vomiting but none in about 4 days. Diarrhea at baseline. No fevers. No travel. No sob/cp/abd pain. Also with sore throat and nasal/chest congestion.       History obtained from: Patient          ROS:      Positive and negative ROS elements as per HPI.  All other systems reviewed and negative.      Physical Exam:      Pulse 90  BP 158/76 mmHg  Resp 20  SpO2 99 %  Temp 97.8 F (36.6 C)    GEN: Well appearing, no distress.    HEENT: Atraumatic, nl conjunctiva - no icterus, PERRL, pharynx erythematous, no tonsillar exudates, right ear with erythema and ?effusion, left TM clear, no drooling/stridor.        Chest: CTAB, no wheeze/rales, moving good air, no resp distress.         CV: RRR, no murmurs.        Abd: Soft, non-tender, non-distended, no rebound/guarding.          MSK: No edema. No obvious deformity, moving all extremities. Well perfused.  Neuro: GCS 15, normal speech, normal memory.         Skin: Warm and dry.   Psych: Normal affect, normal insight.               PAST HISTORY        Primary Care Provider: Christa See, MD        PMH/PSH:    .     Past Medical History   Diagnosis Date   . Hypertensive disorder    . Hypercholesteremia    . Depression        She has past surgical history that includes Cholecystectomy.      Social/Family History:  She reports that she has been smoking Cigarettes.  She has a 35 pack-year smoking history. She does not have any smokeless tobacco history on file. She reports that she drinks alcohol. She reports that she does not use illicit drugs.    Family History   Problem Relation Age of Onset   . Heart disease Mother    . Heart disease Father    . Heart disease Sister          Listed Medications on Arrival:    .     Home Medications                   amLODIPine (NORVASC) 5 MG tablet          atenolol (TENORMIN) 25 MG tablet     Take 25 mg by mouth 2 (two) times daily.     buPROPion XL (WELLBUTRIN XL) 150 MG 24 hr tablet          cyclobenzaprine (FLEXERIL) 5 MG tablet          gabapentin (NEURONTIN) 600 MG tablet          losartan-hydrochlorothiazide (HYZAAR) 100-25 MG per tablet          losartan-hydrochlorothiazide (HYZAAR) 50-12.5 MG per tablet     Take 1 tablet by mouth daily.     OXAYDO 5 MG Tablet Abuse-Deterrent          PAROXETINE HCL PO     Take by mouth daily.         Allergies: She has No Known Allergies.            VISIT INFORMATION        Clinical Course in the ED:      9:33 PM: Re-eval after neb/meds. Pt feels improved. Updated on clear CXR (abnormal abdominal finding likely not relevant/significant - pt not clinically obstructed), plan for albuterol MDI, ibuprofen for pain, abx given ear findings and possible bronchitis.      Filed Vitals:    12/19/15 1928 12/19/15 2130   BP: 158/76 131/78   Pulse: 90 83   Temp: 97.8 F (36.6 C) 98.3 F (36.8 C)   TempSrc: Oral    Resp: 20 18   Height: 5\' 2"  (1.575 m)    Weight: 56.7 kg    SpO2: 99% 96%         Medications Given in the ED:    .     ED Medication Orders     Start Ordered     Status Ordering Provider    12/19/15 2024 12/19/15 2023  albuterol (PROVENTIL) nebulizer solution 2.5 mg   RT - Once     Route: Nebulization  Ordered Dose: 2.5 mg     Last MAR action:  Given Koleen Distance    12/19/15 2021 12/19/15 2020  ibuprofen (ADVIL,MOTRIN) tablet 600 mg   Once     Route: Oral  Ordered Dose: 600 mg     Last MAR action:  Given Koleen Distance    12/19/15 2021 12/19/15 2020  azithromycin (ZITHROMAX) tablet 500 mg   Once     Route: Oral  Ordered Dose: 500 mg     Last MAR action:  Given Delinda Malan M            Procedures:      Procedures      Interpretations:      O2 sat-           saturation:  99 %; Oxygen use: room air; Interpretation: Normal    DDx-              Initial differential diagnosis included: bronchitis, URI, PNA, otitis media, pharyngitis, viral syndrome            RESULTS        Recent Lab Results:      Results     Procedure Component Value Units Date/Time    Rapid Strep (Group A Antigen) [[id number]] Collected:  12/19/15 2050    Specimen Information:  Throat Updated:  12/19/15 2111     Group A Strep, Rapid Antigen Negative               Radiology Results:      XR Chest 2 Views   Final Result    Upper abdominal air-fluid levels. If the patient has signs   or symptoms of obstruction, abdominal flat and upright exam is   recommended.       Bosie Helper, MD    12/19/2015 8:42 PM                     Scribe Attestation:      No scribe involved in the care of this patient         Koleen Distance, MD  12/20/15 2952    Koleen Distance, MD  12/20/15 [phone]

## 2016-03-28 ENCOUNTER — Emergency Department
Admission: EM | Admit: 2016-03-28 | Discharge: 2016-03-28 | Discharge status: Against Medical Advice | Payer: No Typology Code available for payment source | Attending: Emergency Medicine | Admitting: Emergency Medicine

## 2016-03-28 DIAGNOSIS — T43202A Poisoning by unspecified antidepressants, intentional self-harm, initial encounter: Secondary | ICD-10-CM

## 2016-03-28 DIAGNOSIS — Z5321 Procedure and treatment not carried out due to patient leaving prior to being seen by health care provider: Secondary | ICD-10-CM | POA: Insufficient documentation

## 2016-03-28 NOTE — Progress Notes (Signed)
Called to triage by MD because pt wants to leave. Pt is upset because she feels she is being treated like a criminal after being asked to change and be wanded by security. Pt explained rationale for this but insists she will not stay. Pt states she took "a few too many" of her Paroxetine impulsively after an upsetting interaction with her ex-husband. Pt will not say how many pills she took, but is insistent she did not do it with intent to harm herself. Pt continues to deny SI/HI. Pt's daughter accompanied pt to ER but did not witness pt taking pills.    Case discussed with the [facility] for potential ECO/TDO. They state pt would not meet criteria for an ECO or TDO and can be released AMA from the ER. Dr. Salley Scarlet aware.    Romie Levee, LCSW  Psych Emergency Clinician  [facility] Emergency Department  [phone]

## 2016-03-28 NOTE — ED Provider Notes (Addendum)
Triage MD Note:  This patient has been seen by me or placed for labs/radiology studies per request of the Triage RN staff. I am not the primary provider.  Judi Saa, M.D.   4:59 PM      Brought to ED by daughter.  Patient states after having bad interaction with ex-husband, she took "too many" of paroxetine.  Patient denies SI/HI, calm, cooperative.      Judi Saa, MD  03/28/16 1703    When security attempted to wand patient and gather belongings patient became upset stating she is "being treated like a criminal", multiple attempts to calm patient and convince her to stay were unsuccessful.      Discussed with psych liaisons to discuss with patient in attempt to convince her to stay voluntarily.    Discussed with [facility], will sign out patient AMA.      Judi Saa, MD  03/28/16 1737    Judi Saa, MD  03/28/16 [phone]

## 2016-03-29 LAB — ECG 12-LEAD
Atrial Rate: 84 {beats}/min
P Axis: 55 degrees
P-R Interval: 146 ms
Q-T Interval: 380 ms
QRS Duration: 94 ms
QTC Calculation (Bezet): 449 ms
R Axis: 53 degrees
T Axis: 60 degrees
Ventricular Rate: 84 {beats}/min

## 2018-08-28 ENCOUNTER — Encounter (HOSPITAL_COMMUNITY): Payer: Self-pay | Admitting: *Deleted

## 2018-08-28 ENCOUNTER — Emergency Department (HOSPITAL_COMMUNITY): Payer: Medicaid - Out of State

## 2018-08-28 ENCOUNTER — Other Ambulatory Visit: Payer: Self-pay

## 2018-08-28 ENCOUNTER — Inpatient Hospital Stay (HOSPITAL_COMMUNITY)
Admission: EM | Admit: 2018-08-28 | Discharge: 2018-09-01 | DRG: 247 | Disposition: A | Discharge status: Home / Self Care | Payer: Medicaid - Out of State | Attending: Internal Medicine | Admitting: Internal Medicine

## 2018-08-28 DIAGNOSIS — M5090 Cervical disc disorder, unspecified, unspecified cervical region: Secondary | ICD-10-CM | POA: Diagnosis present

## 2018-08-28 DIAGNOSIS — R079 Chest pain, unspecified: Secondary | ICD-10-CM | POA: Diagnosis present

## 2018-08-28 DIAGNOSIS — F17210 Nicotine dependence, cigarettes, uncomplicated: Secondary | ICD-10-CM | POA: Diagnosis present

## 2018-08-28 DIAGNOSIS — I208 Other forms of angina pectoris: Secondary | ICD-10-CM

## 2018-08-28 DIAGNOSIS — E785 Hyperlipidemia, unspecified: Secondary | ICD-10-CM | POA: Diagnosis present

## 2018-08-28 DIAGNOSIS — Z955 Presence of coronary angioplasty implant and graft: Secondary | ICD-10-CM

## 2018-08-28 DIAGNOSIS — I25110 Atherosclerotic heart disease of native coronary artery with unstable angina pectoris: Principal | ICD-10-CM | POA: Diagnosis present

## 2018-08-28 DIAGNOSIS — F32A Depression, unspecified: Secondary | ICD-10-CM | POA: Diagnosis present

## 2018-08-28 DIAGNOSIS — I10 Essential (primary) hypertension: Secondary | ICD-10-CM | POA: Diagnosis present

## 2018-08-28 DIAGNOSIS — Z91013 Allergy to seafood: Secondary | ICD-10-CM

## 2018-08-28 DIAGNOSIS — F329 Major depressive disorder, single episode, unspecified: Secondary | ICD-10-CM | POA: Diagnosis present

## 2018-08-28 DIAGNOSIS — I2510 Atherosclerotic heart disease of native coronary artery without angina pectoris: Secondary | ICD-10-CM | POA: Diagnosis present

## 2018-08-28 DIAGNOSIS — K7581 Nonalcoholic steatohepatitis (NASH): Secondary | ICD-10-CM | POA: Diagnosis present

## 2018-08-28 DIAGNOSIS — M4300 Spondylolysis, site unspecified: Secondary | ICD-10-CM | POA: Diagnosis present

## 2018-08-28 DIAGNOSIS — I200 Unstable angina: Secondary | ICD-10-CM

## 2018-08-28 DIAGNOSIS — Z9861 Coronary angioplasty status: Secondary | ICD-10-CM

## 2018-08-28 DIAGNOSIS — Z7902 Long term (current) use of antithrombotics/antiplatelets: Secondary | ICD-10-CM

## 2018-08-28 DIAGNOSIS — Z79899 Other long term (current) drug therapy: Secondary | ICD-10-CM

## 2018-08-28 DIAGNOSIS — Z720 Tobacco use: Secondary | ICD-10-CM | POA: Diagnosis present

## 2018-08-28 DIAGNOSIS — M5136 Other intervertebral disc degeneration, lumbar region: Secondary | ICD-10-CM | POA: Diagnosis present

## 2018-08-28 DIAGNOSIS — Z8249 Family history of ischemic heart disease and other diseases of the circulatory system: Secondary | ICD-10-CM

## 2018-08-28 HISTORY — DX: Major depressive disorder, single episode, unspecified: F32.9

## 2018-08-28 HISTORY — DX: Depression, unspecified: F32.A

## 2018-08-28 HISTORY — DX: Other intervertebral disc displacement, lumbar region: M51.26

## 2018-08-28 HISTORY — DX: Spondylolysis, site unspecified: M43.00

## 2018-08-28 HISTORY — DX: Nonalcoholic steatohepatitis (NASH): K75.81

## 2018-08-28 HISTORY — DX: Other cervical disc displacement, unspecified cervical region: M50.20

## 2018-08-28 HISTORY — DX: Essential (primary) hypertension: I10

## 2018-08-28 LAB — COMPREHENSIVE METABOLIC PANEL
ALT: 52 U/L — AB (ref 0–44)
AST: 34 U/L (ref 15–41)
Albumin: 4.6 g/dL (ref 3.5–5.0)
Alkaline Phosphatase: 97 U/L (ref 38–126)
Anion gap: 11 (ref 5–15)
BUN: 12 mg/dL (ref 6–20)
CO2: 24 mmol/L (ref 22–32)
CREATININE: 0.59 mg/dL (ref 0.44–1.00)
Calcium: 9.6 mg/dL (ref 8.9–10.3)
Chloride: 101 mmol/L (ref 98–111)
GFR calc Af Amer: >60 mL/min (ref >60)
GFR calc non Af Amer: >60 mL/min (ref >60)
Glucose, Bld: 101 mg/dL — ABNORMAL HIGH (ref 70–99)
Potassium: 3.6 mmol/L (ref 3.5–5.1)
Sodium: 136 mmol/L (ref 135–145)
Total Bilirubin: 0.2 mg/dL — ABNORMAL LOW (ref 0.3–1.2)
Total Protein: 8.8 g/dL — ABNORMAL HIGH (ref 6.5–8.1)

## 2018-08-28 LAB — URINALYSIS, ROUTINE W REFLEX MICROSCOPIC
Bilirubin Urine: NEGATIVE
Glucose, UA: NEGATIVE mg/dL
Ketones, ur: NEGATIVE mg/dL
Nitrite: NEGATIVE
Protein, ur: NEGATIVE mg/dL
Specific Gravity, Urine: 1.002 — ABNORMAL LOW (ref 1.005–1.030)
pH: 6 (ref 5.0–8.0)

## 2018-08-28 LAB — CBC WITH DIFFERENTIAL/PLATELET
Abs Immature Granulocytes: 0.14 10*3/uL — ABNORMAL HIGH (ref 0.00–0.07)
Basophils Absolute: 0.1 10*3/uL (ref 0.0–0.1)
Basophils Relative: 1 %
EOS PCT: 2 %
Eosinophils Absolute: 0.2 10*3/uL (ref 0.0–0.5)
HCT: 44.8 % (ref 36.0–46.0)
HEMOGLOBIN: 14.3 g/dL (ref 12.0–15.0)
Immature Granulocytes: 1 %
Lymphocytes Relative: 47 %
Lymphs Abs: 5.6 10*3/uL — ABNORMAL HIGH (ref 0.7–4.0)
MCH: 32.8 pg (ref 26.0–34.0)
MCHC: 31.9 g/dL (ref 30.0–36.0)
MCV: 102.8 fL — ABNORMAL HIGH (ref 80.0–100.0)
Monocytes Absolute: 1 10*3/uL (ref 0.1–1.0)
Monocytes Relative: 9 %
Neutro Abs: 4.8 10*3/uL (ref 1.7–7.7)
Neutrophils Relative %: 40 %
Platelets: 461 10*3/uL — ABNORMAL HIGH (ref 150–400)
RBC: 4.36 MIL/uL (ref 3.87–5.11)
RDW: 13 % (ref 11.5–15.5)
WBC: 11.9 10*3/uL — ABNORMAL HIGH (ref 4.0–10.5)
nRBC: 0 % (ref 0.0–0.2)

## 2018-08-28 LAB — BRAIN NATRIURETIC PEPTIDE: B Natriuretic Peptide: 18 pg/mL (ref 0.0–100.0)

## 2018-08-28 LAB — TROPONIN I
Troponin I: <0.03 ng/mL (ref <0.03)
Troponin I: <0.03 ng/mL (ref <0.03)

## 2018-08-28 MED ORDER — ONDANSETRON HCL 4 MG/2ML IJ SOLN: 4.0000 mg | Freq: Four times a day (QID) | INTRAMUSCULAR | Status: DC | PRN

## 2018-08-28 MED ORDER — NITROGLYCERIN 0.4 MG SL SUBL
0.4000 mg | SUBLINGUAL_TABLET | SUBLINGUAL | Status: DC | PRN
  Administered 2018-08-28: 0.4 mg via SUBLINGUAL
  Filled 2018-08-28: qty 1

## 2018-08-28 MED ORDER — ASPIRIN 81 MG PO CHEW
324.0000 mg | CHEWABLE_TABLET | Freq: Once | ORAL | Status: AC
  Administered 2018-08-28: 324 mg via ORAL
  Filled 2018-08-28: qty 4

## 2018-08-28 MED ORDER — ENOXAPARIN SODIUM 40 MG/0.4ML ~~LOC~~ SOLN
40.0000 mg | SUBCUTANEOUS | Status: DC
  Administered 2018-08-28 – 2018-08-30 (×3): 40 mg via SUBCUTANEOUS
  Filled 2018-08-28 (×3): qty 0.4

## 2018-08-28 MED ORDER — ACETAMINOPHEN 325 MG PO TABS: 650.0000 mg | ORAL_TABLET | ORAL | Status: DC | PRN

## 2018-08-28 MED ORDER — ASPIRIN EC 325 MG PO TBEC
325.0000 mg | DELAYED_RELEASE_TABLET | Freq: Every day | ORAL | Status: DC
  Administered 2018-08-29 – 2018-08-30 (×2): 325 mg via ORAL
  Filled 2018-08-28 (×2): qty 1

## 2018-08-28 MED ORDER — LOSARTAN POTASSIUM 50 MG PO TABS
100.0000 mg | ORAL_TABLET | Freq: Every morning | ORAL | Status: DC
  Administered 2018-08-29 – 2018-09-01 (×4): 100 mg via ORAL
  Filled 2018-08-28 (×4): qty 2

## 2018-08-28 MED ORDER — AMLODIPINE BESYLATE 5 MG PO TABS
5.0000 mg | ORAL_TABLET | Freq: Every morning | ORAL | Status: DC
  Administered 2018-08-29 – 2018-09-01 (×4): 5 mg via ORAL
  Filled 2018-08-28 (×2): qty 2
  Filled 2018-08-28 (×2): qty 1

## 2018-08-28 MED ORDER — CYCLOBENZAPRINE HCL 10 MG PO TABS
10.0000 mg | ORAL_TABLET | Freq: Three times a day (TID) | ORAL | Status: DC | PRN
  Administered 2018-08-28 – 2018-08-31 (×4): 10 mg via ORAL
  Filled 2018-08-28 (×4): qty 1

## 2018-08-28 MED ORDER — BUPROPION HCL ER (SR) 150 MG PO TB12
150.0000 mg | ORAL_TABLET | Freq: Every day | ORAL | Status: DC
  Administered 2018-08-28 – 2018-08-31 (×4): 150 mg via ORAL
  Filled 2018-08-28 (×4): qty 1

## 2018-08-28 MED ORDER — PAROXETINE HCL 20 MG PO TABS
40.0000 mg | ORAL_TABLET | Freq: Every morning | ORAL | Status: DC
  Administered 2018-08-29 – 2018-09-01 (×4): 40 mg via ORAL
  Filled 2018-08-28 (×4): qty 2

## 2018-08-28 MED ORDER — NICOTINE 21 MG/24HR TD PT24
21.0000 mg | MEDICATED_PATCH | Freq: Every day | TRANSDERMAL | Status: DC
  Administered 2018-08-28 – 2018-09-01 (×5): 21 mg via TRANSDERMAL
  Filled 2018-08-28 (×6): qty 1

## 2018-08-28 NOTE — ED Triage Notes (Signed)
Pt c/o mid chest pain that radiates down the left arm x 1 month. Pt reports that pain has been constant and won't go away. Pt was seen by [facility] urgent care in [place] last night and told pt to go to straight to ED and she went to [facility]. Pt was triaged but left prior to being taken to ED room due to wait time. Pt also c/o SOB, nausea, dizziness, lightheadedness.

## 2018-08-28 NOTE — ED Notes (Signed)
Patient transported to X-ray 

## 2018-08-28 NOTE — ED Provider Notes (Signed)
[facility] EMERGENCY DEPARTMENT Provider Note   CSN: [id number] Arrival date & time: 08/28/18  1349     History   Chief Complaint Chief Complaint  Patient presents with  . Chest Pain    HPI Leslie Rasmussen is a 58 y.o. female.  HPI Patient presents with 59 month of episodic left-sided chest pain.  Describes the pain as pressure-like.  Worse with exertion.  States pain started earlier today.  Is now 4 of 10.  Denies shortness of breath or cough.  No fever or chills.  Patient with significant family history of coronary artery disease.  She smokes a pack cigarettes daily for the past 45 years.  No previous cardiac history. Past Medical History:  Diagnosis Date  . Depression   . Herniated disc, cervical   . Hypertension   . Lumbar herniated disc   . NASH (nonalcoholic steatohepatitis)   . Spondylolysis     There are no active problems to display for this patient.   Past Surgical History:  Procedure Laterality Date  . CHOLECYSTECTOMY       OB History   No obstetric history on file.      Home Medications    Prior to Admission medications   Not on File    Family History Family History  Problem Relation Age of Onset  . Angina Father   . Aortic stenosis Father   . Aortic stenosis Sister     Social History Social History   Tobacco Use  . Smoking status: Current Every Day Smoker    Packs/day: 1.00    Years: 45.00    Pack years: 45.00    Types: Cigarettes  . Smokeless tobacco: Never Used  Substance Use Topics  . Alcohol use: Yes    Frequency: Never    Comment: occasionally   . Drug use: Never     Allergies   Crab [shellfish allergy]   Review of Systems Review of Systems  Constitutional: Positive for fatigue. Negative for chills and fever.  Respiratory: Negative for cough and shortness of breath.   Cardiovascular: Positive for chest pain. Negative for palpitations and leg swelling.  Gastrointestinal: Negative for abdominal pain, constipation,  diarrhea, nausea and vomiting.  Genitourinary: Negative for dysuria, flank pain and frequency.  Musculoskeletal: Negative for back pain, myalgias and neck pain.  Skin: Negative for rash and wound.  Neurological: Negative for dizziness, weakness, light-headedness, numbness and headaches.  All other systems reviewed and are negative.    Physical Exam Updated Vital Signs BP 119/75   Pulse 82   Temp 98.1 F (36.7 C)   Resp 16   Ht 5\' 2"  (1.575 m)   Wt 63.5 kg   SpO2 99%   BMI 25.61 kg/m   Physical Exam Vitals signs and nursing note reviewed.  Constitutional:      General: She is not in acute distress.    Appearance: Normal appearance. She is well-developed. She is not ill-appearing.  HENT:     Head: Normocephalic and atraumatic.     Nose: Nose normal.     Mouth/Throat:     Mouth: Mucous membranes are moist.  Eyes:     Extraocular Movements: Extraocular movements intact.     Pupils: Pupils are equal, round, and reactive to light.  Neck:     Musculoskeletal: Normal range of motion and neck supple. No neck rigidity or muscular tenderness.     Vascular: No carotid bruit.  Cardiovascular:     Rate and Rhythm: Normal rate and  regular rhythm.     Heart sounds: No murmur. No friction rub. No gallop.   Pulmonary:     Effort: Pulmonary effort is normal. No respiratory distress.     Breath sounds: Normal breath sounds. No stridor. No wheezing, rhonchi or rales.  Chest:     Chest wall: No tenderness.  Abdominal:     General: Bowel sounds are normal. There is no distension.     Palpations: Abdomen is soft.     Tenderness: There is no abdominal tenderness. There is no right CVA tenderness, left CVA tenderness, guarding or rebound.  Musculoskeletal: Normal range of motion.        General: No swelling, tenderness, deformity or signs of injury.     Right lower leg: No edema.     Left lower leg: No edema.  Lymphadenopathy:     Cervical: No cervical adenopathy.  Skin:    General:  Skin is warm and dry.     Findings: No erythema or rash.  Neurological:     General: No focal deficit present.     Mental Status: She is alert and oriented to person, place, and time.  Psychiatric:        Mood and Affect: Mood normal.        Behavior: Behavior normal.      ED Treatments / Results  Labs (all labs ordered are listed, but only abnormal results are displayed) Labs Reviewed  CBC WITH DIFFERENTIAL/PLATELET - Abnormal; Notable for the following components:      Result Value   WBC 11.9 (*)    MCV 102.8 (*)    Platelets 461 (*)    Lymphs Abs 5.6 (*)    Abs Immature Granulocytes 0.14 (*)    All other components within normal limits  COMPREHENSIVE METABOLIC PANEL - Abnormal; Notable for the following components:   Glucose, Bld 101 (*)    Total Protein 8.8 (*)    ALT 52 (*)    Total Bilirubin 0.2 (*)    All other components within normal limits  BRAIN NATRIURETIC PEPTIDE  TROPONIN I  URINALYSIS, ROUTINE W REFLEX MICROSCOPIC    EKG EKG Interpretation  Date/Time:  Friday August 28 2018 14:01:59 EST Ventricular Rate:  96 PR Interval:    QRS Duration: 103 QT Interval:  364 QTC Calculation: 460 R Axis:   62 Text Interpretation:  Sinus rhythm Confirmed by Loren Racer ([id number]) on 08/28/2018 2:22:06 PM   Radiology Dg Chest 2 View  Result Date: 08/28/2018 CLINICAL DATA:  Chest pain.  Shortness of breath. EXAM: CHEST - 2 VIEW COMPARISON:  None. FINDINGS: The heart size and mediastinal contours are within normal limits. Both lungs are clear. The visualized skeletal structures are unremarkable. IMPRESSION: No active cardiopulmonary disease. Electronically Signed   By: Francene Boyers M.D.   On: 08/28/2018 14:45    Procedures Procedures (including critical care time)  Medications Ordered in ED Medications  nitroGLYCERIN (NITROSTAT) SL tablet 0.4 mg (0.4 mg Sublingual Given 08/28/18 1456)  aspirin chewable tablet 324 mg (324 mg Oral Given 08/28/18 1455)      Initial Impression / Assessment and Plan / ED Course  I have reviewed the triage vital signs and the nursing notes.  Pertinent labs & imaging results that were available during my care of the patient were reviewed by me and considered in my medical decision making (see chart for details).     Given aspirin and nitroglycerin in the emergency department. Patient is currently chest pain-free.  Discussed with hospitalist who will admit for rule out. Final Clinical Impressions(s) / ED Diagnoses   Final diagnoses:  Exertional angina [facility](HCC)    ED Discharge Orders    None       Loren RacerYelverton, Janayah Zavada, MD 08/28/18 [phone]

## 2018-08-28 NOTE — H&P (Signed)
History and Physical    Leslie Rasmussen IHD:[id number] DOB: 12-Jun-1961 DOA: 08/28/2018  PCP: System, Pcp Not In  Patient coming from: Home  I have personally briefly reviewed patient's old medical records in [place]  Chief Complaint: Chest pain  HPI: Leslie Rasmussen is a 58 y.o. female with medical history significant for hypertension, NASH, depression, cervical and lumbar disc disease, and tobacco use who presents to the ED with complaint of chest pain.  Patient reports 1 month of on and off pressure-like left-sided chest pain which occurs on minimal exertion such as going up a flight of stairs.  During these episodes she has associated dyspnea and occasional lightheadedness without syncope.  She also reports left arm pain that occurs when she is trying to sleep and sometimes in her right arm as well but states these pains occur separately from her chest pressure discomfort.  She reports infrequent palpitations.  She is a chronic smoker of 1 pack/day for about 40 years.  She drinks 1-2 beers a night and denies any illicit drug use.  She reports cardiac disease in her father, brother, and sister.  [facility] [place] [facility] was told to go to the ED.  She then presented to [facility] however left prior to being seen due to long wait time and therefore came to [facility].  ED Course:  Initial vitals showed BP 101/77, pulse 93, RR 19, temp 98.1 Fahrenheit, SPO2 94% on room air.  Initial labs notable for troponin I <0.03, BNP 18.0, WBC 11.9, hemoglobin 14.3, platelets 461, urinalysis negative for UTI.  2 view chest x-ray was without consolidation, infiltrate, edema, or effusions.  EKG showed normal sinus rhythm, rate 96 bpm, no acute ischemic changes.  Patient was given aspirin 324 mg once and nitroglycerin with improvement in chest pain.  The hospitalist service was consulted to admit for chest pain rule out.  Review of Systems: As  per HPI otherwise 10 point review of systems negative.    Past Medical History:  Diagnosis Date  . Depression   . Herniated disc, cervical   . Hypertension   . Lumbar herniated disc   . NASH (nonalcoholic steatohepatitis)   . Spondylolysis     Past Surgical History:  Procedure Laterality Date  . CHOLECYSTECTOMY       reports that she has been smoking cigarettes. She has a 45.00 pack-year smoking history. She has never used smokeless tobacco. She reports current alcohol use. She reports that she does not use drugs.  Allergies  Allergen Reactions  . Crab [Shellfish Allergy] Other (See Comments)    Migraines     Family History  Problem Relation Age of Onset  . Angina Father   . Aortic stenosis Father   . Aortic stenosis Sister      Prior to Admission medications   Not on File    Physical Exam: Vitals:   08/28/18 1600 08/28/18 1900 08/28/18 2002 08/28/18 2003  BP: 115/74 113/64  137/82  Pulse: 79 84  89  Resp: 16 16  20   Temp:    98.6 F (37 C)  TempSrc:    Oral  SpO2: 98% 97% 97% 98%  Weight:      Height:        Constitutional: NAD, calm, comfortable, sitting up in bed Eyes: PERRL, lids and conjunctivae normal ENMT: Mucous membranes are moist. Posterior pharynx clear of any exudate or lesions.Normal dentition.  Neck: normal, supple, no  masses. Respiratory: clear to auscultation bilaterally, no wheezing, no crackles. Normal respiratory effort. No accessory muscle use.  Cardiovascular: Regular rate and rhythm, no murmurs / rubs / gallops. No extremity edema. Abdomen: Mild RUQ tenderness, no masses palpated. Bowel sounds positive.  Musculoskeletal: no clubbing / cyanosis. No joint deformity upper and lower extremities. Good ROM, no contractures. Normal muscle tone.  Skin: no rashes, lesions, ulcers. No induration Neurologic: CN 2-12 grossly intact. Sensation intact, Strength 5/5 in all 4.  Psychiatric: Normal judgment and insight. Alert and oriented x 3.  Normal mood.     Labs on Admission: I have personally reviewed following labs and imaging studies  CBC: Recent Labs  Lab 08/28/18 1410  WBC 11.9*  NEUTROABS 4.8  HGB 14.3  HCT 44.8  MCV 102.8*  PLT 975*   Basic Metabolic Panel: Recent Labs  Lab 08/28/18 1410  NA 136  K 3.6  CL 101  CO2 24  GLUCOSE 101*  BUN 12  CREATININE 0.59  CALCIUM 9.6   GFR: Estimated Creatinine Clearance: 67.2 mL/min (by C-G formula based on SCr of 0.59 mg/dL). Liver Function Tests: Recent Labs  Lab 08/28/18 1410  AST 34  ALT 52*  ALKPHOS 97  BILITOT 0.2*  PROT 8.8*  ALBUMIN 4.6   No results for input(s): LIPASE, AMYLASE in the last 168 hours. No results for input(s): AMMONIA in the last 168 hours. Coagulation Profile: No results for input(s): INR, PROTIME in the last 168 hours. Cardiac Enzymes: Recent Labs  Lab 08/28/18 1410 08/28/18 2023  TROPONINI <0.03 <0.03   BNP (last 3 results) No results for input(s): PROBNP in the last 8760 hours. HbA1C: No results for input(s): HGBA1C in the last 72 hours. CBG: No results for input(s): GLUCAP in the last 168 hours. Lipid Profile: No results for input(s): CHOL, HDL, LDLCALC, TRIG, CHOLHDL, LDLDIRECT in the last 72 hours. Thyroid Function Tests: No results for input(s): TSH, T4TOTAL, FREET4, T3FREE, THYROIDAB in the last 72 hours. Anemia Panel: No results for input(s): VITAMINB12, FOLATE, FERRITIN, TIBC, IRON, RETICCTPCT in the last 72 hours. Urine analysis:    Component Value Date/Time   COLORURINE STRAW (A) 08/28/2018 1442   APPEARANCEUR CLEAR 08/28/2018 1442   LABSPEC 1.002 (L) 08/28/2018 1442   PHURINE 6.0 08/28/2018 1442   GLUCOSEU NEGATIVE 08/28/2018 1442   HGBUR SMALL (A) 08/28/2018 1442   BILIRUBINUR NEGATIVE 08/28/2018 1442   KETONESUR NEGATIVE 08/28/2018 1442   PROTEINUR NEGATIVE 08/28/2018 1442   NITRITE NEGATIVE 08/28/2018 1442   LEUKOCYTESUR TRACE (A) 08/28/2018 1442    Radiological Exams on Admission: Dg  Chest 2 View  Result Date: 08/28/2018 CLINICAL DATA:  Chest pain.  Shortness of breath. EXAM: CHEST - 2 VIEW COMPARISON:  None. FINDINGS: The heart size and mediastinal contours are within normal limits. Both lungs are clear. The visualized skeletal structures are unremarkable. IMPRESSION: No active cardiopulmonary disease. Electronically Signed   By: Lorriane Shire M.D.   On: 08/28/2018 14:45    EKG: Independently reviewed.  Normal sinus rhythm, rate 96 bpm, no acute ischemic changes.  Assessment/Plan Principal Problem:   Chest pain Active Problems:   NASH (nonalcoholic steatohepatitis)   Hypertension   Depression   Tobacco use   Leslie Rasmussen is a 58 y.o. female with medical history significant for hypertension, NASH, depression, cervical and lumbar disc disease, and tobacco use who is admitted with chest pain.   Chest pain: Patient with mixed typical and atypical features.  He does have some symptoms concerning for angina.  Her left arm pain which he reports occurring only at night is likely musculoskeletal in nature and contributed by her cervical disc disease. -Cycle troponins -Monitor on telemetry -Recheck EKG in a.m. -Continue aspirin, PRN nitroglycerin -Consider inpatient versus outpatient stress testing  Hypertension: Currently stable.  Continue amlodipine and losartan.  NASH: LFTs currently stable AST 34, ALT 52, alk phos 97, T bili 0.2.  No acute issue.  Depression: Continue home paroxetine and Wellbutrin.  Cervical and lumbar disc disease: Continue home Flexeril.  Tobacco use: -Smoking cessation counseling provided  DVT prophylaxis: Lovenox Code Status: Full code Family Communication: Discussed with patient, husband, and daughter at bedside Disposition Plan: Likely discharge home in 1-2 days Consults called: None Admission status: Observation   Zada Finders MD [facility] Pager [phone]  If 7PM-7AM, please contact  night-coverage [email or web address] Password [facility]  08/28/2018, 9:29 PM

## 2018-08-29 ENCOUNTER — Other Ambulatory Visit: Payer: Self-pay

## 2018-08-29 ENCOUNTER — Encounter (HOSPITAL_COMMUNITY): Payer: Self-pay

## 2018-08-29 ENCOUNTER — Observation Stay (HOSPITAL_BASED_OUTPATIENT_CLINIC_OR_DEPARTMENT_OTHER): Payer: Medicaid - Out of State

## 2018-08-29 DIAGNOSIS — I10 Essential (primary) hypertension: Secondary | ICD-10-CM | POA: Diagnosis not present

## 2018-08-29 DIAGNOSIS — I208 Other forms of angina pectoris: Secondary | ICD-10-CM | POA: Diagnosis not present

## 2018-08-29 DIAGNOSIS — R072 Precordial pain: Secondary | ICD-10-CM | POA: Diagnosis not present

## 2018-08-29 DIAGNOSIS — Z720 Tobacco use: Secondary | ICD-10-CM | POA: Diagnosis not present

## 2018-08-29 DIAGNOSIS — K7581 Nonalcoholic steatohepatitis (NASH): Secondary | ICD-10-CM

## 2018-08-29 LAB — HIV ANTIBODY (ROUTINE TESTING W REFLEX): HIV Screen 4th Generation wRfx: NONREACTIVE

## 2018-08-29 LAB — ECHOCARDIOGRAM COMPLETE
Height: 62 in
Weight: 2224 oz

## 2018-08-29 LAB — TROPONIN I: Troponin I: <0.03 ng/mL (ref <0.03)

## 2018-08-29 NOTE — Progress Notes (Signed)
Has denied chest pain today and vitals have been stable, SR in the 80's.  To be transferred to [facility] for stress test.  Report called to Fermin Schwab, RN

## 2018-08-29 NOTE — Progress Notes (Signed)
Echocardiogram 2D Echocardiogram has been performed.  Leslie Rasmussen 08/29/2018, 10:37 AM

## 2018-08-29 NOTE — Progress Notes (Signed)
PROGRESS NOTE  Leslie Rasmussen MIW:[id number] DOB: [birth year] DOA: 08/28/2018 PCP: System, Pcp Not In   LOS: 0 days   Brief Narrative / Interim history: 58 year old female with history of hypertension, [place], depression, 40-pack-year smoking history, presents to the ED with complaints of chest pain.  She reports about a month of on and off pressure-like left-sided chest pain which occurs on minimal exertion such as going up a flight of stairs or walking a few yards.  This is associated with shortness of breath and occasional lightheadedness.  Sometimes chest pain is associated with left shoulder and left arm pain.  Usually when this happens she needs to rest, sit down, and within about 10 minutes the chest pain goes away.  She states that the chest pain is very predictable.  She does report that at one point she experienced the similar chest pain while sitting down and not necessarily doing any activity.  She states that her sister had 2 stents in her heart about 2 years ago, and her father also had an MI and required stent placement when he was about 24 years old.  Her brother also has a history of heart disease but he has some "valve problems".  Subjective: -Feeling well, no chest pain, no shortness of breath  Assessment & Plan: Principal Problem:   Chest pain Active Problems:   NASH (nonalcoholic steatohepatitis)   Hypertension   Depression   Tobacco use   Principal Problem Exertional angina -Patient presented to the hospital with exertional angina, with predictable and consistent chest pain every time she walks or going up a flight of stairs, relieved by rest.  She reports couple occasions when this chest pain appears at rest.  Given history of hypertension, strong family history, 40-pack-year smoking history she is at high risk for significant CAD -EKG is no acute ischemic findings, troponin has been negative, and 2D echo is unremarkable as below.  I have discussed case with Dr. Percival Spanish  with cardiology, and based on history she will eventually need him to have a cardiac catheterization, thus patient will be [facility] to [facility] for evaluation for cardiac catheterization.  Cardiology is not available at this hospital currently -Report given to my colleague hospitalist Dr. Lonny Prude  Active Problems Hypertension -Blood pressure controlled, continue amlodipine and losartan  Karlene Lineman -LFTs stable on admission without active issues  Depression -Continue home paroxetine and Wellbutrin  Cervical lumbar disc disease -Continue home Flexeril  Tobacco use -Continue nicotine patch, smoking cessation provided, patient determined to quit  Scheduled Meds: . amLODipine  5 mg Oral q morning - 10a  . aspirin EC  325 mg Oral Daily  . buPROPion  150 mg Oral QHS  . enoxaparin (LOVENOX) injection  40 mg Subcutaneous Q24H  . losartan  100 mg Oral q morning - 10a  . nicotine  21 mg Transdermal Daily  . PARoxetine  40 mg Oral q morning - 10a   Continuous Infusions: PRN Meds:.acetaminophen, cyclobenzaprine, nitroGLYCERIN, ondansetron (ZOFRAN) IV  DVT prophylaxis: Lovenox Code Status: Full code Family Communication: Daughter present at bedside Disposition Plan: Transfer to [facility] for cardiology evaluation  Consultants:   Cardiology - phone   Procedures:   2D echo:  Study Conclusions - Left ventricle: The cavity size was normal. Systolic function was normal. The estimated ejection fraction was in the range of 60% to 65%. Wall motion was normal; there were no regional wall motion abnormalities. Doppler parameters are consistent with abnormal left ventricular relaxation (  grade 1 diastolic dysfunction).  Antimicrobials:  None    Objective: Vitals:   08/28/18 2003 08/29/18 0558 08/29/18 1000 08/29/18 1400  BP:  119/89 121/84 (!) 144/86  Pulse:  85 79 100  Resp:  18 19 18   Temp: 98.6 F (37 C) 97.8 F (36.6 C) 97.8 F (36.6 C) 97.9 F  (36.6 C)  TempSrc: Oral Oral Oral Oral  SpO2:  98% 98% 93%  Weight: 63 kg     Height: 5' 2"  (1.575 m)       Intake/Output Summary (Last 24 hours) at 08/29/2018 1618 Last data filed at 08/29/2018 1300 Gross per 24 hour  Intake 480 ml  Output -  Net 480 ml   Filed Weights   08/28/18 1402 08/28/18 2003  Weight: 63.5 kg 63 kg    Examination:  Constitutional: NAD Eyes: PERRL, lids and conjunctivae normal ENMT: Mucous membranes are moist.  Respiratory: clear to auscultation bilaterally, no wheezing, no crackles. Cardiovascular: Regular rate and rhythm, no murmurs / rubs / gallops. No LE edema. Abdomen: no tenderness. Bowel sounds positive.  Musculoskeletal: no clubbing / cyanosis.  Skin: no rashes Neurologic: CN 2-12 grossly intact. Strength 5/5 in all 4.  Psychiatric: Normal judgment and insight. Alert and oriented x 3. Normal mood.    Data Reviewed: I have independently reviewed following labs and imaging studies   CBC: Recent Labs  Lab 08/28/18 1410  WBC 11.9*  NEUTROABS 4.8  HGB 14.3  HCT 44.8  MCV 102.8*  PLT 300*   Basic Metabolic Panel: Recent Labs  Lab 08/28/18 1410  NA 136  K 3.6  CL 101  CO2 24  GLUCOSE 101*  BUN 12  CREATININE 0.59  CALCIUM 9.6   GFR: Estimated Creatinine Clearance: 66.9 mL/min (by C-G formula based on SCr of 0.59 mg/dL). Liver Function Tests: Recent Labs  Lab 08/28/18 1410  AST 34  ALT 52*  ALKPHOS 97  BILITOT 0.2*  PROT 8.8*  ALBUMIN 4.6   No results for input(s): LIPASE, AMYLASE in the last 168 hours. No results for input(s): AMMONIA in the last 168 hours. Coagulation Profile: No results for input(s): INR, PROTIME in the last 168 hours. Cardiac Enzymes: Recent Labs  Lab 08/28/18 1410 08/28/18 2023 08/29/18 0239  TROPONINI <0.03 <0.03 <0.03   BNP (last 3 results) No results for input(s): PROBNP in the last 8760 hours. HbA1C: No results for input(s): HGBA1C in the last 72 hours. CBG: No results for  input(s): GLUCAP in the last 168 hours. Lipid Profile: No results for input(s): CHOL, HDL, LDLCALC, TRIG, CHOLHDL, LDLDIRECT in the last 72 hours. Thyroid Function Tests: No results for input(s): TSH, T4TOTAL, FREET4, T3FREE, THYROIDAB in the last 72 hours. Anemia Panel: No results for input(s): VITAMINB12, FOLATE, FERRITIN, TIBC, IRON, RETICCTPCT in the last 72 hours. Urine analysis:    Component Value Date/Time   COLORURINE STRAW (A) 08/28/2018 1442   APPEARANCEUR CLEAR 08/28/2018 1442   LABSPEC 1.002 (L) 08/28/2018 1442   PHURINE 6.0 08/28/2018 1442   GLUCOSEU NEGATIVE 08/28/2018 1442   HGBUR SMALL (A) 08/28/2018 1442   BILIRUBINUR NEGATIVE 08/28/2018 1442   KETONESUR NEGATIVE 08/28/2018 1442   PROTEINUR NEGATIVE 08/28/2018 1442   NITRITE NEGATIVE 08/28/2018 1442   LEUKOCYTESUR TRACE (A) 08/28/2018 1442   Sepsis Labs: Invalid input(s): PROCALCITONIN, LACTICIDVEN  No results found for this or any previous visit (from the past 240 hour(s)).    Radiology Studies: Dg Chest 2 View  Result Date: 08/28/2018 CLINICAL DATA:  Chest pain.  Shortness of breath. EXAM: CHEST - 2 VIEW COMPARISON:  None. FINDINGS: The heart size and mediastinal contours are within normal limits. Both lungs are clear. The visualized skeletal structures are unremarkable. IMPRESSION: No active cardiopulmonary disease. Electronically Signed   By: Lorriane Shire M.D.   On: 08/28/2018 14:45     Marzetta Board, MD, PhD [facility]  Contact via  [email or web address]  [place] P: [phone]  F: [phone]

## 2018-08-30 ENCOUNTER — Encounter (HOSPITAL_COMMUNITY): Payer: Self-pay | Admitting: Physician Assistant

## 2018-08-30 DIAGNOSIS — I200 Unstable angina: Secondary | ICD-10-CM

## 2018-08-30 DIAGNOSIS — Z720 Tobacco use: Secondary | ICD-10-CM | POA: Diagnosis not present

## 2018-08-30 DIAGNOSIS — F329 Major depressive disorder, single episode, unspecified: Secondary | ICD-10-CM | POA: Diagnosis not present

## 2018-08-30 DIAGNOSIS — I208 Other forms of angina pectoris: Secondary | ICD-10-CM | POA: Diagnosis not present

## 2018-08-30 DIAGNOSIS — I10 Essential (primary) hypertension: Secondary | ICD-10-CM | POA: Diagnosis not present

## 2018-08-30 LAB — PROTIME-INR
INR: 0.94
Prothrombin Time: 12.5 seconds (ref 11.4–15.2)

## 2018-08-30 MED ORDER — SODIUM CHLORIDE 0.9% FLUSH: 3.0000 mL | INTRAVENOUS | Status: DC | PRN

## 2018-08-30 MED ORDER — ASPIRIN EC 81 MG PO TBEC
81.0000 mg | DELAYED_RELEASE_TABLET | Freq: Every day | ORAL | Status: DC
  Administered 2018-09-01: 10:00:00 81 mg via ORAL
  Filled 2018-08-30: qty 1

## 2018-08-30 MED ORDER — SODIUM CHLORIDE 0.9 % WEIGHT BASED INFUSION: 1.0000 mL/kg/h | INTRAVENOUS | Status: DC

## 2018-08-30 MED ORDER — SODIUM CHLORIDE 0.9% FLUSH
3.0000 mL | Freq: Two times a day (BID) | INTRAVENOUS | Status: DC
  Administered 2018-08-30 (×2): 3 mL via INTRAVENOUS

## 2018-08-30 MED ORDER — SODIUM CHLORIDE 0.9 % WEIGHT BASED INFUSION
3.0000 mL/kg/h | INTRAVENOUS | Status: DC
  Administered 2018-08-31: 3 mL/kg/h via INTRAVENOUS

## 2018-08-30 MED ORDER — ASPIRIN 81 MG PO CHEW
81.0000 mg | CHEWABLE_TABLET | ORAL | Status: AC
  Administered 2018-08-31: 81 mg via ORAL
  Filled 2018-08-30: qty 1

## 2018-08-30 MED ORDER — CARVEDILOL 3.125 MG PO TABS
3.1250 mg | ORAL_TABLET | Freq: Two times a day (BID) | ORAL | Status: DC
  Administered 2018-08-30 – 2018-09-01 (×5): 3.125 mg via ORAL
  Filled 2018-08-30 (×5): qty 1

## 2018-08-30 MED ORDER — SODIUM CHLORIDE 0.9% FLUSH
3.0000 mL | Freq: Two times a day (BID) | INTRAVENOUS | Status: DC
  Administered 2018-08-30: 3 mL via INTRAVENOUS

## 2018-08-30 MED ORDER — SODIUM CHLORIDE 0.9 % WEIGHT BASED INFUSION
1.0000 mL/kg/h | INTRAVENOUS | Status: DC
  Administered 2018-08-31: 1 mL/kg/h via INTRAVENOUS

## 2018-08-30 MED ORDER — SODIUM CHLORIDE 0.9 % WEIGHT BASED INFUSION: 3.0000 mL/kg/h | INTRAVENOUS | Status: DC

## 2018-08-30 MED ORDER — SODIUM CHLORIDE 0.9 % IV SOLN: 250.0000 mL | INTRAVENOUS | Status: DC | PRN

## 2018-08-30 MED ORDER — NITROGLYCERIN 0.4 MG SL SUBL: 0.4000 mg | SUBLINGUAL_TABLET | Freq: Once | SUBLINGUAL | Status: DC | PRN

## 2018-08-30 MED ORDER — ASPIRIN 81 MG PO CHEW: 81.0000 mg | CHEWABLE_TABLET | ORAL | Status: DC

## 2018-08-30 NOTE — Progress Notes (Signed)
Progress Note    Leslie Rasmussen  ZOX:[id number] DOB: 1961-07-03  DOA: 08/28/2018 PCP: System, Pcp Not In    Brief Narrative:   Chief complaint: Chest pain  Medical records reviewed and are as summarized below:  Leslie PertMartha Rasmussen is an 58 y.o. female with past medical history significant for HTN, Nash, depression, and 40-pack-year smoking history; who initially presented to the emergency department with complaints of chest pain.  Symptoms had been ongoing over the last month usually occurring with exertion and relieved with rest.  Associated symptoms include left-sided shoulder and arm pain.  Patient reports significant family history of MI father and brother.  Assessment/Plan:   Principal Problem:   Chest pain Active Problems:   NASH (nonalcoholic steatohepatitis)   Hypertension   Depression   Tobacco use  Chest pain/exertional angina: Patient with sustained chest pressure interpreted with exertion.  EKG does not show significant ischemic changes and troponins were negative x3.  2D echo was relatively unremarkable.  Patient was transferred from any pain to cardiac catheterization after talks with Dr. Antoine PocheHochrein. - N.p.o. for possible need of procedure - Appreciate cardiology consultative services will follow further recommendations  Essential hypertension - Continue losartan and amlodipine  Depression - Continue paroxetine and Wellbutrin  Nash: LFTs previously noted stable  Depression - Continue Paxil and Wellbutrin  Cervical lumbar disc disease - Flexeril as needed  Tobacco Abuse: Patient currently continues to smoke cigarettes - I have counseled the patient on the need of cessation of smoking  - A nicotine patch was offered   Body mass index is 25.06 kg/m.   Family Communication/Anticipated D/C date and plan/Code Status   DVT prophylaxis: Lovenox ordered. Code Status: Full Code.  Family Communication: No family present at bedside Disposition Plan: Likely discharge  home in 2 to 3 days   Medical Consultants:    Cardiology consult   Anti-Infectives:    None  Subjective:   Patient reports being chest pain-free at this time no complaints.  Objective:    Vitals:   08/30/18 0017 08/30/18 0545 08/30/18 0546 08/30/18 0722  BP: 133/79 (!) 140/94  (!) 132/91  Pulse: 85 88  90  Resp: 18 16  20   Temp: 98.3 F (36.8 C) 97.7 F (36.5 C)  (!) 97.5 F (36.4 C)  TempSrc: Oral Oral  Oral  SpO2: 98% 98%  100%  Weight:   62.1 kg   Height:        Intake/Output Summary (Last 24 hours) at 08/30/2018 0902 Last data filed at 08/30/2018 0020 Gross per 24 hour  Intake 480 ml  Output 400 ml  Net 80 ml   Filed Weights   08/28/18 2003 08/29/18 1839 08/30/18 0546  Weight: 63 kg 62.6 kg 62.1 kg    Exam: Constitutional: NAD, calm, comfortable Eyes: PERRL, lids and conjunctivae normal ENMT: Mucous membranes are moist. Posterior pharynx clear of any exudate or lesions.Normal dentition.  Neck: normal, supple, no masses, no thyromegaly Respiratory: clear to auscultation bilaterally, no wheezing, no crackles. Normal respiratory effort. No accessory muscle use.  Cardiovascular: Regular rate and rhythm, no murmurs / rubs / gallops. No extremity edema. 2+ pedal pulses. No carotid bruits.  Abdomen: no tenderness, no masses palpated. No hepatosplenomegaly. Bowel sounds positive.  Musculoskeletal: no clubbing / cyanosis. No joint deformity upper and lower extremities. Good ROM, no contractures. Normal muscle tone.  Skin: no rashes, lesions, ulcers. No induration Neurologic: CN 2-12 grossly intact. Sensation intact, DTR normal. Strength 5/5 in all 4.  Psychiatric: Normal judgment and insight. Alert and oriented x 3. Normal mood.    Data Reviewed:   I have personally reviewed following labs and imaging studies: Procedures:   2D echo:  Study Conclusions - Left ventricle: The cavity size was normal. Systolic function wasnormal. The estimated ejection  fraction was in the range of 60%to 65%. Wall motion was normal; there were no regional wallmotion abnormalities. Doppler parameters are consistent withabnormal left ventricular relaxation (grade 1 diastolicdysfunction). Labs: Labs show the following:   Basic Metabolic Panel: Recent Labs  Lab 08/28/18 1410  NA 136  K 3.6  CL 101  CO2 24  GLUCOSE 101*  BUN 12  CREATININE 0.59  CALCIUM 9.6   GFR Estimated Creatinine Clearance: 66.4 mL/min (by C-G formula based on SCr of 0.59 mg/dL). Liver Function Tests: Recent Labs  Lab 08/28/18 1410  AST 34  ALT 52*  ALKPHOS 97  BILITOT 0.2*  PROT 8.8*  ALBUMIN 4.6   No results for input(s): LIPASE, AMYLASE in the last 168 hours. No results for input(s): AMMONIA in the last 168 hours. Coagulation profile No results for input(s): INR, PROTIME in the last 168 hours.  CBC: Recent Labs  Lab 08/28/18 1410  WBC 11.9*  NEUTROABS 4.8  HGB 14.3  HCT 44.8  MCV 102.8*  PLT 461*   Cardiac Enzymes: Recent Labs  Lab 08/28/18 1410 08/28/18 2023 08/29/18 0239  TROPONINI <0.03 <0.03 <0.03   BNP (last 3 results) No results for input(s): PROBNP in the last 8760 hours. CBG: No results for input(s): GLUCAP in the last 168 hours. D-Dimer: No results for input(s): DDIMER in the last 72 hours. Hgb A1c: No results for input(s): HGBA1C in the last 72 hours. Lipid Profile: No results for input(s): CHOL, HDL, LDLCALC, TRIG, CHOLHDL, LDLDIRECT in the last 72 hours. Thyroid function studies: No results for input(s): TSH, T4TOTAL, T3FREE, THYROIDAB in the last 72 hours.  Invalid input(s): FREET3 Anemia work up: No results for input(s): VITAMINB12, FOLATE, FERRITIN, TIBC, IRON, RETICCTPCT in the last 72 hours. Sepsis Labs: Recent Labs  Lab 08/28/18 1410  WBC 11.9*    Microbiology No results found for this or any previous visit (from the past 240 hour(s)).  Procedures and diagnostic studies:  Dg Chest 2 View  Result Date:  08/28/2018 CLINICAL DATA:  Chest pain.  Shortness of breath. EXAM: CHEST - 2 VIEW COMPARISON:  None. FINDINGS: The heart size and mediastinal contours are within normal limits. Both lungs are clear. The visualized skeletal structures are unremarkable. IMPRESSION: No active cardiopulmonary disease. Electronically Signed   By: Francene BoyersJames  Maxwell M.D.   On: 08/28/2018 14:45    Medications:   . amLODipine  5 mg Oral q morning - 10a  . aspirin EC  325 mg Oral Daily  . buPROPion  150 mg Oral QHS  . enoxaparin (LOVENOX) injection  40 mg Subcutaneous Q24H  . losartan  100 mg Oral q morning - 10a  . nicotine  21 mg Transdermal Daily  . PARoxetine  40 mg Oral q morning - 10a   Continuous Infusions:   LOS: 0 days   Antar Milks A Maurine Mowbray  [facility]   *Please refer to [email or web address], password [email or web address] to get updated schedule on who will round on this patient, as hospitalists switch teams weekly. If 7PM-7AM, please contact night-coverage at [email or web address], password [email or web address] for any overnight needs.

## 2018-08-30 NOTE — Progress Notes (Signed)
Assisted pt in ordering breakfast  

## 2018-08-30 NOTE — Consult Note (Addendum)
Cardiology Consultation:   Patient ID: Leslie Rasmussen; [id number]; March 27, 1961   Admit date: 08/28/2018 Date of Consult: 08/30/2018  Primary Care Provider: System, Pcp Not In Primary Cardiologist: No primary care provider on file. New Primary Electrophysiologist:  None   Patient Profile:   Leslie Rasmussen is a 58 y.o. female with a hx of NASH, HTN, back/neck issues, depression, tob use, who is being seen today for the evaluation of chest pain at the request of Dr Tamala Julian.  History of Present Illness:   Ms. Leslie Rasmussen sees doctors occasionally, but no recent physical.   She was climbing stairs daily, was getting SOB with this. About a month ago, she started getting chest pain going up stairs.   Then she started getting chest pain with other activities, such as shopping. It is a tightness and pressure. Worst was 7/10. Sx relieved by rest in 15-20 min.   Within the past 2 weeks, she started having chest pain at rest. It would go completely away in between. At least 5 episodes in the last week. The pain would radiate down 1 arm or the other. Her neck also hurts, but she has disc problems there. She will get SOB w/ the pain, sometimes nausea, +diaphoresis.  She started taking a baby ASA.   Her daughter made her seek help, she initially went to [facility] ER, never got seen. Yesterday, she went to [facility], was seen and sent to [facility].   Currently pain-free.   No recent lipid check, but has been on a low-cholesterol diet for the [facility] for over a year.   Past Medical History:  Diagnosis Date  . Depression   . Herniated disc, cervical   . Hypertension   . Lumbar herniated disc   . NASH (nonalcoholic steatohepatitis)   . Spondylolysis     Past Surgical History:  Procedure Laterality Date  . CHOLECYSTECTOMY       Prior to Admission medications   Medication Sig Start Date End Date Taking? Authorizing Provider  amLODipine (NORVASC) 5 MG tablet Take 5 mg by mouth every morning. 12/13/15  Yes [provider]  buPROPion (WELLBUTRIN SR) 150 MG 12 hr tablet Take 150 mg by mouth at bedtime.   Yes [provider]  cyclobenzaprine (FLEXERIL) 10 MG tablet Take 10 mg by mouth 3 (three) times daily as needed for muscle spasms.   Yes [provider]  losartan (COZAAR) 100 MG tablet Take 100 mg by mouth every morning.   Yes [provider]  PARoxetine (PAXIL) 40 MG tablet Take 40 mg by mouth every morning.   Yes [provider]    Inpatient Medications: Scheduled Meds: . amLODipine  5 mg Oral q morning - 10a  . aspirin EC  325 mg Oral Daily  . buPROPion  150 mg Oral QHS  . enoxaparin (LOVENOX) injection  40 mg Subcutaneous Q24H  . losartan  100 mg Oral q morning - 10a  . nicotine  21 mg Transdermal Daily  . PARoxetine  40 mg Oral q morning - 10a   Continuous Infusions:  PRN Meds: acetaminophen, cyclobenzaprine, nitroGLYCERIN, ondansetron (ZOFRAN) IV  Allergies:    Allergies  Allergen Reactions  . Crab [Shellfish Allergy] Other (See Comments)    Migraines     Social History:   Social History   Socioeconomic History  . Marital status: Married    Spouse name: Not on file  . Number of children: Not on file  . Years of education: Not on file  .  Highest education level: Not on file  Occupational History  . Occupation: Retired  Scientific laboratory technician  . Financial resource strain: Not on file  . Food insecurity:    Worry: Not on file    Inability: Not on file  . Transportation needs:    Medical: Not on file    Non-medical: Not on file  Tobacco Use  . Smoking status: Current Every Day Smoker    Packs/day: 1.00    Years: 45.00    Pack years: 45.00    Types: Cigarettes  . Smokeless tobacco: Never Used  Substance and Sexual Activity  . Alcohol use: Yes    Frequency: Never    Comment: occasionally   . Drug use: Never  . Sexual activity: Not on file  Lifestyle  . Physical activity:    Days per week: Not on file    Minutes per session: Not  on file  . Stress: Not on file  Relationships  . Social connections:    Talks on phone: Not on file    Gets together: Not on file    Attends religious service: Not on file    Active member of club or organization: Not on file    Attends meetings of clubs or organizations: Not on file    Relationship status: Not on file  . Intimate partner violence:    Fear of current or ex partner: Not on file    Emotionally abused: Not on file    Physically abused: Not on file    Forced sexual activity: Not on file  Other Topics Concern  . Not on file  Social History Narrative  . Not on file    Family History:   Family History  Problem Relation Age of Onset  . Angina Father 73       Lived to 18  . Aortic stenosis Father   . Aortic stenosis Sister   . Heart disease Sister        has stents, dx late 43s, early 68s  . Stroke Mother        Died at 64  . Aortic stenosis Brother    Family Status:  Family Status  Relation Name Status  . Father  Deceased  . Sister  Alive  . Mother  Deceased  . Brother  Alive    ROS:  Please see the history of present illness.  All other ROS reviewed and negative.     Physical Exam/Data:   Vitals:   08/30/18 0545 08/30/18 0546 08/30/18 0722 08/30/18 0903  BP: (!) 140/94  (!) 132/91 136/76  Pulse: 88  90 79  Resp: 16  20   Temp: 97.7 F (36.5 C)  (!) 97.5 F (36.4 C)   TempSrc: Oral  Oral   SpO2: 98%  100%   Weight:  62.1 kg    Height:        Intake/Output Summary (Last 24 hours) at 08/30/2018 1020 Last data filed at 08/30/2018 0900 Gross per 24 hour  Intake 720 ml  Output 1300 ml  Net -580 ml   Filed Weights   08/28/18 2003 08/29/18 1839 08/30/18 0546  Weight: 63 kg 62.6 kg 62.1 kg   Body mass index is 25.06 kg/m.  General:  Well nourished, well developed, in no acute distress HEENT: normal Lymph: no adenopathy Neck: no JVD Endocrine:  No thryomegaly Vascular: No carotid bruits; 4/4 extremity pulses 2+, without bruits  Cardiac:   normal S1, S2; RRR; no murmur Lungs:  clear  to auscultation bilaterally, no wheezing, rhonchi or rales  Abd: soft, nontender, no hepatomegaly  Ext: no edema Musculoskeletal:  No deformities, BUE and BLE strength normal and equal Skin: warm and dry  Neuro:  CNs 2-12 intact, no focal abnormalities noted Psych:  Normal affect   EKG:  The EKG was personally reviewed and demonstrates: 08/29/2018, sinus rhythm, heart rate 76, no acute ischemic changes and normal intervals.  No old available for comparison Telemetry:  Telemetry was personally reviewed and demonstrates: Sinus rhythm, no significant ectopy  Relevant CV Studies:  ECHO: 08/29/2018 - Left ventricle: The cavity size was normal. Systolic function was   normal. The estimated ejection fraction was in the range of 60%   to 65%. Wall motion was normal; there were no regional wall   motion abnormalities. Doppler parameters are consistent with   abnormal left ventricular relaxation (grade 1 diastolic   dysfunction).  Laboratory Data:  Chemistry Recent Labs  Lab 08/28/18 1410  NA 136  K 3.6  CL 101  CO2 24  GLUCOSE 101*  BUN 12  CREATININE 0.59  CALCIUM 9.6  GFRNONAA >60  GFRAA >60  ANIONGAP 11    Lab Results  Component Value Date   ALT 52 (H) 08/28/2018   AST 34 08/28/2018   ALKPHOS 97 08/28/2018   BILITOT 0.2 (L) 08/28/2018   Hematology Recent Labs  Lab 08/28/18 1410  WBC 11.9*  RBC 4.36  HGB 14.3  HCT 44.8  MCV 102.8*  MCH 32.8  MCHC 31.9  RDW 13.0  PLT 461*   Cardiac Enzymes Recent Labs  Lab 08/28/18 1410 08/28/18 2023 08/29/18 0239  TROPONINI <0.03 <0.03 <0.03     BNP Recent Labs  Lab 08/28/18 1410  BNP 18.0    DDimer No results for input(s): DDIMER in the last 168 hours. TSH: No results found for: TSH Lipids:No results found for: CHOL, HDL, LDLCALC, LDLDIRECT, TRIG, CHOLHDL HgbA1c:No results found for: HGBA1C Magnesium: No results found for: MG   Radiology/Studies:  Dg Chest 2  View  Result Date: 08/28/2018 CLINICAL DATA:  Chest pain.  Shortness of breath. EXAM: CHEST - 2 VIEW COMPARISON:  None. FINDINGS: The heart size and mediastinal contours are within normal limits. Both lungs are clear. The visualized skeletal structures are unremarkable. IMPRESSION: No active cardiopulmonary disease. Electronically Signed   By: Lorriane Shire M.D.   On: 08/28/2018 14:45    Assessment and Plan:   Principal Problem: 1.  Chest pain -1 month history of exertional chest pain that has progressed to occasional resting chest pain. -Cardiac risk factors include hypertension, tobacco use, family history of premature coronary artery disease and unknown lipid status. - The risks and benefits of a cardiac catheterization including, but not limited to, death, stroke, MI, kidney damage and bleeding were discussed with the patient who indicates understanding and agrees to proceed.  -Check lipids, encouraged tobacco cessation -Believe her blood pressure is high enough to tolerate a beta-blocker, will add low-dose Coreg.  She is on her home doses of amlodipine 5 mg and losartan 100 mg  Otherwise, per IM Active Problems:   NASH (nonalcoholic steatohepatitis)   Hypertension   Depression   Tobacco use     For questions or updates, please contact [facility] Please consult [email or web address] for contact info under Cardiology/STEMI.   Signed, Rosaria Ferries, PA-C  08/30/2018 10:20 AM  Patient seen and examined with the above-signed Advanced Practice Provider and/or Housestaff. I personally reviewed laboratory data, imaging studies and relevant notes.  I independently examined the patient and formulated the important aspects of the plan. I have edited the note to reflect any of my changes or salient points. I have personally discussed the plan with the patient and/or family.  58 y/o woman smoker with h/o HTN and FHX poremature CAD. Presents with 1 month h/o progressive exertional CP very  concerning for angina. Now pain free. Troponin and ECG ok. Echo reviewed personally and normal EF. Agree with plan for cath tomorrow. Procedure discussed in detail. Ok to proceed. Treat with ASA and statin.   Glori Bickers, MD  1:41 PM

## 2018-08-31 ENCOUNTER — Encounter (HOSPITAL_COMMUNITY)
Admission: EM | Disposition: A | Discharge status: Home / Self Care | Payer: Self-pay | Source: Home / Self Care | Attending: Internal Medicine

## 2018-08-31 DIAGNOSIS — M4300 Spondylolysis, site unspecified: Secondary | ICD-10-CM | POA: Diagnosis present

## 2018-08-31 DIAGNOSIS — M5090 Cervical disc disorder, unspecified, unspecified cervical region: Secondary | ICD-10-CM | POA: Diagnosis present

## 2018-08-31 DIAGNOSIS — I200 Unstable angina: Secondary | ICD-10-CM | POA: Diagnosis not present

## 2018-08-31 DIAGNOSIS — Z7902 Long term (current) use of antithrombotics/antiplatelets: Secondary | ICD-10-CM | POA: Diagnosis not present

## 2018-08-31 DIAGNOSIS — I25110 Atherosclerotic heart disease of native coronary artery with unstable angina pectoris: Secondary | ICD-10-CM | POA: Diagnosis present

## 2018-08-31 DIAGNOSIS — E785 Hyperlipidemia, unspecified: Secondary | ICD-10-CM | POA: Diagnosis present

## 2018-08-31 DIAGNOSIS — Z91013 Allergy to seafood: Secondary | ICD-10-CM | POA: Diagnosis not present

## 2018-08-31 DIAGNOSIS — I208 Other forms of angina pectoris: Secondary | ICD-10-CM | POA: Diagnosis present

## 2018-08-31 DIAGNOSIS — F329 Major depressive disorder, single episode, unspecified: Secondary | ICD-10-CM | POA: Diagnosis present

## 2018-08-31 DIAGNOSIS — Z720 Tobacco use: Secondary | ICD-10-CM | POA: Diagnosis not present

## 2018-08-31 DIAGNOSIS — Z9861 Coronary angioplasty status: Secondary | ICD-10-CM

## 2018-08-31 DIAGNOSIS — Z8249 Family history of ischemic heart disease and other diseases of the circulatory system: Secondary | ICD-10-CM | POA: Diagnosis not present

## 2018-08-31 DIAGNOSIS — I2510 Atherosclerotic heart disease of native coronary artery without angina pectoris: Secondary | ICD-10-CM | POA: Diagnosis not present

## 2018-08-31 DIAGNOSIS — F17210 Nicotine dependence, cigarettes, uncomplicated: Secondary | ICD-10-CM | POA: Diagnosis present

## 2018-08-31 DIAGNOSIS — K7581 Nonalcoholic steatohepatitis (NASH): Secondary | ICD-10-CM | POA: Diagnosis present

## 2018-08-31 DIAGNOSIS — M5136 Other intervertebral disc degeneration, lumbar region: Secondary | ICD-10-CM | POA: Diagnosis present

## 2018-08-31 DIAGNOSIS — I10 Essential (primary) hypertension: Secondary | ICD-10-CM | POA: Diagnosis present

## 2018-08-31 DIAGNOSIS — Z79899 Other long term (current) drug therapy: Secondary | ICD-10-CM | POA: Diagnosis not present

## 2018-08-31 HISTORY — PX: CORONARY STENT INTERVENTION: CATH118234

## 2018-08-31 HISTORY — PX: INTRAVASCULAR ULTRASOUND/IVUS: CATH118244

## 2018-08-31 HISTORY — PX: LEFT HEART CATH AND CORONARY ANGIOGRAPHY: CATH118249

## 2018-08-31 HISTORY — PX: INTRAVASCULAR PRESSURE WIRE/FFR STUDY: CATH118243

## 2018-08-31 LAB — LIPID PANEL
CHOL/HDL RATIO: 6.6 ratio
Cholesterol: 284 mg/dL — ABNORMAL HIGH (ref 0–200)
HDL: 43 mg/dL (ref >40)
LDL Cholesterol: 163 mg/dL — ABNORMAL HIGH (ref 0–99)
Triglycerides: 390 mg/dL — ABNORMAL HIGH (ref <150)
VLDL: 78 mg/dL — ABNORMAL HIGH (ref 0–40)

## 2018-08-31 LAB — POCT ACTIVATED CLOTTING TIME
Activated Clotting Time: 406 seconds
Activated Clotting Time: 477 seconds

## 2018-08-31 LAB — PROTIME-INR
INR: 1.04
Prothrombin Time: 13.5 seconds (ref 11.4–15.2)

## 2018-08-31 SURGERY — LEFT HEART CATH AND CORONARY ANGIOGRAPHY
Anesthesia: LOCAL

## 2018-08-31 MED ORDER — ANGIOPLASTY BOOK
Freq: Once | Status: AC
  Administered 2018-08-31: 23:00:00
  Filled 2018-08-31: qty 1

## 2018-08-31 MED ORDER — HEPARIN (PORCINE) IN NACL 1000-0.9 UT/500ML-% IV SOLN
INTRAVENOUS | Status: DC | PRN
  Administered 2018-08-31 (×2): 500 mL

## 2018-08-31 MED ORDER — SODIUM CHLORIDE 0.9% FLUSH
3.0000 mL | Freq: Two times a day (BID) | INTRAVENOUS | Status: DC
  Administered 2018-08-31: 3 mL via INTRAVENOUS

## 2018-08-31 MED ORDER — LIDOCAINE HCL (PF) 1 % IJ SOLN
INTRAMUSCULAR | Status: DC | PRN
  Administered 2018-08-31: 2 mL

## 2018-08-31 MED ORDER — TICAGRELOR 90 MG PO TABS
90.0000 mg | ORAL_TABLET | Freq: Two times a day (BID) | ORAL | Status: DC
  Administered 2018-09-01: 90 mg via ORAL
  Filled 2018-08-31: qty 1

## 2018-08-31 MED ORDER — SODIUM CHLORIDE 0.9% FLUSH: 3.0000 mL | INTRAVENOUS | Status: DC | PRN

## 2018-08-31 MED ORDER — ATORVASTATIN CALCIUM 40 MG PO TABS
40.0000 mg | ORAL_TABLET | Freq: Every day | ORAL | Status: DC
  Administered 2018-08-31: 40 mg via ORAL
  Filled 2018-08-31: qty 1

## 2018-08-31 MED ORDER — FENTANYL CITRATE (PF) 100 MCG/2ML IJ SOLN
INTRAMUSCULAR | Status: DC | PRN
  Administered 2018-08-31: 50 ug via INTRAVENOUS
  Administered 2018-08-31: 25 ug via INTRAVENOUS

## 2018-08-31 MED ORDER — FENTANYL CITRATE (PF) 100 MCG/2ML IJ SOLN
INTRAMUSCULAR | Status: AC
  Filled 2018-08-31: qty 2

## 2018-08-31 MED ORDER — SODIUM CHLORIDE 0.9 % IV SOLN
INTRAVENOUS | Status: AC
  Administered 2018-08-31: 16:00:00 via INTRAVENOUS

## 2018-08-31 MED ORDER — THE SENSUOUS HEART BOOK
Freq: Once | Status: AC
  Administered 2018-08-31: 23:00:00
  Filled 2018-08-31: qty 1

## 2018-08-31 MED ORDER — HEPARIN SODIUM (PORCINE) 1000 UNIT/ML IJ SOLN
INTRAMUSCULAR | Status: AC
  Filled 2018-08-31: qty 1

## 2018-08-31 MED ORDER — TICAGRELOR 90 MG PO TABS
ORAL_TABLET | ORAL | Status: DC | PRN
  Administered 2018-08-31: 180 mg via ORAL

## 2018-08-31 MED ORDER — IOHEXOL 350 MG/ML SOLN
INTRAVENOUS | Status: DC | PRN
  Administered 2018-08-31: 155 mL via INTRACARDIAC

## 2018-08-31 MED ORDER — HEPARIN (PORCINE) IN NACL 1000-0.9 UT/500ML-% IV SOLN
INTRAVENOUS | Status: AC
  Filled 2018-08-31: qty 1000

## 2018-08-31 MED ORDER — MIDAZOLAM HCL 2 MG/2ML IJ SOLN
INTRAMUSCULAR | Status: AC
  Filled 2018-08-31: qty 2

## 2018-08-31 MED ORDER — TICAGRELOR 90 MG PO TABS
ORAL_TABLET | ORAL | Status: AC
  Filled 2018-08-31: qty 2

## 2018-08-31 MED ORDER — NITROGLYCERIN 1 MG/10 ML FOR IR/CATH LAB
INTRA_ARTERIAL | Status: DC | PRN
  Administered 2018-08-31: 200 ug

## 2018-08-31 MED ORDER — HYDRALAZINE HCL 20 MG/ML IJ SOLN: 5.0000 mg | INTRAMUSCULAR | Status: AC | PRN

## 2018-08-31 MED ORDER — MIDAZOLAM HCL 2 MG/2ML IJ SOLN
INTRAMUSCULAR | Status: DC | PRN
  Administered 2018-08-31 (×2): 1 mg via INTRAVENOUS

## 2018-08-31 MED ORDER — HEPARIN SODIUM (PORCINE) 1000 UNIT/ML IJ SOLN
INTRAMUSCULAR | Status: DC | PRN
  Administered 2018-08-31: 5500 [IU] via INTRAVENOUS
  Administered 2018-08-31: 3500 [IU] via INTRAVENOUS

## 2018-08-31 MED ORDER — LIDOCAINE HCL (PF) 1 % IJ SOLN
INTRAMUSCULAR | Status: AC
  Filled 2018-08-31: qty 30

## 2018-08-31 MED ORDER — VERAPAMIL HCL 2.5 MG/ML IV SOLN
INTRAVENOUS | Status: DC | PRN
  Administered 2018-08-31: 14:00:00 via INTRA_ARTERIAL

## 2018-08-31 MED ORDER — LABETALOL HCL 5 MG/ML IV SOLN: 10.0000 mg | INTRAVENOUS | Status: AC | PRN

## 2018-08-31 MED ORDER — SODIUM CHLORIDE 0.9 % IV SOLN: 250.0000 mL | INTRAVENOUS | Status: DC | PRN

## 2018-08-31 MED ORDER — VERAPAMIL HCL 2.5 MG/ML IV SOLN
INTRAVENOUS | Status: AC
  Filled 2018-08-31: qty 2

## 2018-08-31 SURGICAL SUPPLY — 18 items
BALLN SAPPHIRE 2.0X15 (BALLOONS) ×2
BALLN SAPPHIRE ~~LOC~~ 2.25X15 (BALLOONS) ×2 IMPLANT
BALLOON SAPPHIRE 2.0X15 (BALLOONS) ×1 IMPLANT
CATH 5FR JL3.5 JR4 ANG PIG MP (CATHETERS) ×2 IMPLANT
CATH DRAGONFLY OPTIS 2.7FR (CATHETERS) ×2 IMPLANT
CATH VISTA GUIDE 6FR JR4 (CATHETERS) ×2 IMPLANT
CATH VISTA GUIDE 6FR XBLAD3.5 (CATHETERS) ×2 IMPLANT
DEVICE RAD COMP TR BAND LRG (VASCULAR PRODUCTS) ×2 IMPLANT
GLIDESHEATH SLEND SS 6F .021 (SHEATH) ×2 IMPLANT
GUIDEWIRE INQWIRE 1.5J.035X260 (WIRE) ×1 IMPLANT
GUIDEWIRE PRESSURE COMET II (WIRE) ×2 IMPLANT
INQWIRE 1.5J .035X260CM (WIRE) ×2
KIT ENCORE 26 ADVANTAGE (KITS) ×4 IMPLANT
KIT HEART LEFT (KITS) ×2 IMPLANT
PACK CARDIAC CATHETERIZATION (CUSTOM PROCEDURE TRAY) ×2 IMPLANT
STENT SYNERGY DES 2.25X24 (Permanent Stent) ×2 IMPLANT
TRANSDUCER W/STOPCOCK (MISCELLANEOUS) ×2 IMPLANT
TUBING CIL FLEX 10 FLL-RA (TUBING) ×2 IMPLANT

## 2018-08-31 NOTE — Progress Notes (Signed)
[facility] PROGRESS NOTE  Leslie PertMartha Rasmussen ZOX:[id number] DOB: Jan 08, 1961 DOA: 08/28/2018 PCP: System, Pcp Not In  Assessment/Plan: Chest pain/exertional angina: episode chest pain last night while going to [place].   EKG does not show significant ischemic changes and troponins were negative x3.  2D echo EF 60% no wall motion abnormality. Evaluated by cardiology who recommended cath. Cath results pending. - Appreciate cardiology consultative services will follow further recommendations -follow cath results  -likely discharge in am -supportive therapy for chest pain  Essential hypertension only fair control. Home meds include losartan and amlodipine. Coreg added yesterday -continue coreg -continue losartan and amlodipine -monitor  Depression stable at baseline - Continue paroxetine and Wellbutrin  Nash: LFTs previously noted stable. OP follow up  Cervical lumbar disc disease. Stable at baseline - Flexeril as needed  Tobacco Abuse: Patient currently continues to smoke cigarettes - cessation counseling offered  - A nicotine patch was offered   Code Status: full Family Communication: none present Disposition Plan: home hopefully in am   Consultants:  Dr berry cards  Dr bensimhon cards  Procedures:  Cath 08/31/18  Echo 08/29/18  Antibiotics:    HPI/Subjective: Leslie PertMartha Pommier is a 58 y.o. female with a hx of NASH, HTN, back/neck issues, depression, tob use, who was admitted 08/28/18 with chest pain. Evaluated by cards who recommended cath that was done today  Reports chest pain while ambulating to BR this am. Did not report. Lasted 30 minutes  Objective: Vitals:   08/31/18 0845 08/31/18 1218  BP: (!) 143/90 137/77  Pulse: 88 70  Resp:  18  Temp:  97.6 F (36.4 C)  SpO2: 100% 100%    Intake/Output Summary (Last 24 hours) at 08/31/2018 1340 Last data filed at 08/31/2018 0536 Gross per 24 hour  Intake 786.3 ml  Output 1600 ml  Net -813.7 ml   Filed Weights    08/29/18 1839 08/30/18 0546 08/31/18 0459  Weight: 62.6 kg 62.1 kg 62.5 kg    Exam:   General:  Well nourished alert no acute distress poor dentition  Cardiovascular: rrr no mgr no LE edema  Respiratory: normal effort BS clear bilaterally no wheeze  Abdomen: non-distended non-tender +BS no guarding or rebounding  Musculoskeletal: joints without swelling/erythema   Data Reviewed: Basic Metabolic Panel: Recent Labs  Lab 08/28/18 1410  NA 136  K 3.6  CL 101  CO2 24  GLUCOSE 101*  BUN 12  CREATININE 0.59  CALCIUM 9.6   Liver Function Tests: Recent Labs  Lab 08/28/18 1410  AST 34  ALT 52*  ALKPHOS 97  BILITOT 0.2*  PROT 8.8*  ALBUMIN 4.6   No results for input(s): LIPASE, AMYLASE in the last 168 hours. No results for input(s): AMMONIA in the last 168 hours. CBC: Recent Labs  Lab 08/28/18 1410  WBC 11.9*  NEUTROABS 4.8  HGB 14.3  HCT 44.8  MCV 102.8*  PLT 461*   Cardiac Enzymes: Recent Labs  Lab 08/28/18 1410 08/28/18 2023 08/29/18 0239  TROPONINI <0.03 <0.03 <0.03   BNP (last 3 results) Recent Labs    08/28/18 1410  BNP 18.0    ProBNP (last 3 results) No results for input(s): PROBNP in the last 8760 hours.  CBG: No results for input(s): GLUCAP in the last 168 hours.  No results found for this or any previous visit (from the past 240 hour(s)).   Studies: No results found.  Scheduled Meds: . amLODipine  5 mg Oral q morning - 10a  . [START ON 09/01/2018]  aspirin EC  81 mg Oral Daily  . atorvastatin  40 mg Oral q1800  . buPROPion  150 mg Oral QHS  . carvedilol  3.125 mg Oral BID WC  . enoxaparin (LOVENOX) injection  40 mg Subcutaneous Q24H  . losartan  100 mg Oral q morning - 10a  . nicotine  21 mg Transdermal Daily  . PARoxetine  40 mg Oral q morning - 10a  . sodium chloride flush  3 mL Intravenous Q12H  . sodium chloride flush  3 mL Intravenous Q12H   Continuous Infusions: . sodium chloride    . sodium chloride    . sodium  chloride    . sodium chloride 1 mL/kg/hr (08/31/18 0536)    Principal Problem:   Chest pain Active Problems:   NASH (nonalcoholic steatohepatitis)   Hypertension   Depression   Tobacco use    Time spent: 45 minutes    [facility] Special[facility] Surgery CenterBLACK,Jacorie Ernsberger M NP  [facility]  If 7PM-7AM, please contact night-coverage at [email or web address], password [email or web address] 08/31/2018, 1:40 PM  LOS: 0 days

## 2018-08-31 NOTE — H&P (View-Only) (Signed)
Progress Note  Patient Name: Leslie Rasmussen Date of Encounter: 08/31/2018  Primary Cardiologist: New-Dr. Gwenlyn Found   Subjective   Anticipating cath today for further evaluation. Had one episode of chest pressure while ambulating overnight. Denies chest pain today.   Inpatient Medications    Scheduled Meds: . amLODipine  5 mg Oral q morning - 10a  . [START ON 09/01/2018] aspirin EC  81 mg Oral Daily  . buPROPion  150 mg Oral QHS  . carvedilol  3.125 mg Oral BID WC  . enoxaparin (LOVENOX) injection  40 mg Subcutaneous Q24H  . losartan  100 mg Oral q morning - 10a  . nicotine  21 mg Transdermal Daily  . PARoxetine  40 mg Oral q morning - 10a  . sodium chloride flush  3 mL Intravenous Q12H  . sodium chloride flush  3 mL Intravenous Q12H   Continuous Infusions: . sodium chloride    . sodium chloride    . sodium chloride    . sodium chloride 1 mL/kg/hr (08/31/18 0536)   PRN Meds: sodium chloride, sodium chloride, acetaminophen, cyclobenzaprine, nitroGLYCERIN, ondansetron (ZOFRAN) IV, sodium chloride flush, sodium chloride flush   Vital Signs    Vitals:   08/30/18 2010 08/31/18 0459 08/31/18 0501 08/31/18 0845  BP: (!) 141/84  (!) 148/84 (!) 143/90  Pulse: 88  89 88  Resp: 16  16   Temp: 98.3 F (36.8 C)  97.6 F (36.4 C)   TempSrc: Oral  Oral   SpO2: 100%  100% 100%  Weight:  62.5 kg    Height:        Intake/Output Summary (Last 24 hours) at 08/31/2018 1104 Last data filed at 08/31/2018 0536 Gross per 24 hour  Intake 786.3 ml  Output 1600 ml  Net -813.7 ml   Filed Weights   08/29/18 1839 08/30/18 0546 08/31/18 0459  Weight: 62.6 kg 62.1 kg 62.5 kg    Physical Exam   General: Well developed, well nourished, NAD Skin: Warm, dry, intact  Head: Normocephalic, atraumatic, clear, moist mucus membranes. Neck: Negative for carotid bruits. No JVD Lungs:Clear to ausculation bilaterally. No wheezes, rales, or rhonchi. Breathing is unlabored. Cardiovascular: RRR with  S1 S2. No murmurs, rubs, gallops, or LV heave appreciated. MSK: Strength and tone appear normal for age. 5/5 in all extremities Extremities: No edema. No clubbing or cyanosis. DP/PT pulses 2+ bilaterally Neuro: Alert and oriented. No focal deficits. No facial asymmetry. MAE spontaneously. Psych: Responds to questions appropriately with normal affect.    Labs    Chemistry Recent Labs  Lab 08/28/18 1410  NA 136  K 3.6  CL 101  CO2 24  GLUCOSE 101*  BUN 12  CREATININE 0.59  CALCIUM 9.6  PROT 8.8*  ALBUMIN 4.6  AST 34  ALT 52*  ALKPHOS 97  BILITOT 0.2*  GFRNONAA >60  GFRAA >60  ANIONGAP 11     Hematology Recent Labs  Lab 08/28/18 1410  WBC 11.9*  RBC 4.36  HGB 14.3  HCT 44.8  MCV 102.8*  MCH 32.8  MCHC 31.9  RDW 13.0  PLT 461*    Cardiac Enzymes Recent Labs  Lab 08/28/18 1410 08/28/18 2023 08/29/18 0239  TROPONINI <0.03 <0.03 <0.03   No results for input(s): TROPIPOC in the last 168 hours.   BNP Recent Labs  Lab 08/28/18 1410  BNP 18.0    DDimer No results for input(s): DDIMER in the last 168 hours.   Radiology    No results found.  Telemetry    08/31/18 NSR - Personally Reviewed  ECG    No new tracing as of 08/31/18 - Personally Reviewed  Cardiac Studies   ECHO: 08/29/2018 - Left ventricle: The cavity size was normal. Systolic function was normal. The estimated ejection fraction was in the range of 60% to 65%. Wall motion was normal; there were no regional wall motion abnormalities. Doppler parameters are consistent with abnormal left ventricular relaxation (grade 1 diastolic dysfunction).  Patient Profile     58 y.o. female with a hx of NASH, HTN, back/neck issues, depression, tob use, who is being seen today for the evaluation of chest pain at the request of Dr Tamala Julian.  Assessment & Plan    1. Chest pain: -Presented with exertional chest pain which had progressed to occsaional resting discomfort concerning for ACS who  presented to [facility] ED and was never seen. She then went to [facility] ED and was transferred to [facility] for further evaluation.  -Trop, <0.03, <0.03, <0.03 -EKG with no acute ST-T wave changes  -Echocardiogram 08/29/18 with 60-65% with no NWMA and G1DD -Given significant risk factors consisting of HTN, tobacco use, and family hx of premature CAD  -Continue ASA  -Will add statin today   2. HLD: -Lipid panel, LDL at 163 -Will need LFT's and repeat lipid panel in 6-8 weeks  3. Tobacco Use: -Smoking cessation strongly encouraged   4. HTN: -Stable,143/90>148/84>141/84  -Continue current regimen>>can adjust in the post cath setting    Signed, Kathyrn Drown NP-C [facility] Pager: [phone] 08/31/2018, 11:04 AM    Agree with note by Kathyrn Drown NP   Leslie Rasmussen has positive cardiac risk factors and a good story for unstable angina.  Her enzymes are negative and her EKG shows no changes.  She has had recurrent pain in the hospital.  Plan for left heart cath today.  Her exam is benign. I have reviewed the risks, indications, and alternatives to cardiac catheterization, possible angioplasty, and stenting with the patient. Risks include but are not limited to bleeding, infection, vascular injury, stroke, myocardial infection, arrhythmia, kidney injury, radiation-related injury in the case of prolonged fluoroscopy use, emergency cardiac surgery, and death. The patient understands the risks of serious complication is 1-2 in 8250 with diagnostic cardiac cath and 1-2% or less with angioplasty/stenting.   Lorretta Harp, M.D., [place], [facility], [facility] [address] [place], [place]  [address]  [phone] 08/31/2018 12:19 PM  For questions or updates, please contact   Please consult [email or web address] for contact info under Cardiology/STEMI.

## 2018-08-31 NOTE — Interval H&P Note (Signed)
History and Physical Interval Note:  08/31/2018 1:51 PM  Leslie Rasmussen  has presented today for cardiac cath with the diagnosis of unstable angina  The various methods of treatment have been discussed with the patient and family. After consideration of risks, benefits and other options for treatment, the patient has consented to  Procedure(s): LEFT HEART CATH AND CORONARY ANGIOGRAPHY (N/A) as a surgical intervention .  The patient's history has been reviewed, patient examined, no change in status, stable for surgery.  I have reviewed the patient's chart and labs.  Questions were answered to the patient's satisfaction.    Cath Lab Visit (complete for each Cath Lab visit)  Clinical Evaluation Leading to the Procedure:   ACS: No.  Non-ACS:    Anginal Classification: CCS III  Anti-ischemic medical therapy: Minimal Therapy (1 class of medications)  Non-Invasive Test Results: No non-invasive testing performed  Prior CABG: No previous CABG         Lauree Chandler

## 2018-08-31 NOTE — Progress Notes (Addendum)
Progress Note  Patient Name: Leslie Rasmussen Date of Encounter: 08/31/2018  Primary Cardiologist: New-Dr. Gwenlyn Found   Subjective   Anticipating cath today for further evaluation. Had one episode of chest pressure while ambulating overnight. Denies chest pain today.   Inpatient Medications    Scheduled Meds: . amLODipine  5 mg Oral q morning - 10a  . [START ON 09/01/2018] aspirin EC  81 mg Oral Daily  . buPROPion  150 mg Oral QHS  . carvedilol  3.125 mg Oral BID WC  . enoxaparin (LOVENOX) injection  40 mg Subcutaneous Q24H  . losartan  100 mg Oral q morning - 10a  . nicotine  21 mg Transdermal Daily  . PARoxetine  40 mg Oral q morning - 10a  . sodium chloride flush  3 mL Intravenous Q12H  . sodium chloride flush  3 mL Intravenous Q12H   Continuous Infusions: . sodium chloride    . sodium chloride    . sodium chloride    . sodium chloride 1 mL/kg/hr (08/31/18 0536)   PRN Meds: sodium chloride, sodium chloride, acetaminophen, cyclobenzaprine, nitroGLYCERIN, ondansetron (ZOFRAN) IV, sodium chloride flush, sodium chloride flush   Vital Signs    Vitals:   08/30/18 2010 08/31/18 0459 08/31/18 0501 08/31/18 0845  BP: (!) 141/84  (!) 148/84 (!) 143/90  Pulse: 88  89 88  Resp: 16  16   Temp: 98.3 F (36.8 C)  97.6 F (36.4 C)   TempSrc: Oral  Oral   SpO2: 100%  100% 100%  Weight:  62.5 kg    Height:        Intake/Output Summary (Last 24 hours) at 08/31/2018 1104 Last data filed at 08/31/2018 0536 Gross per 24 hour  Intake 786.3 ml  Output 1600 ml  Net -813.7 ml   Filed Weights   08/29/18 1839 08/30/18 0546 08/31/18 0459  Weight: 62.6 kg 62.1 kg 62.5 kg    Physical Exam   General: Well developed, well nourished, NAD Skin: Warm, dry, intact  Head: Normocephalic, atraumatic, clear, moist mucus membranes. Neck: Negative for carotid bruits. No JVD Lungs:Clear to ausculation bilaterally. No wheezes, rales, or rhonchi. Breathing is unlabored. Cardiovascular: RRR with  S1 S2. No murmurs, rubs, gallops, or LV heave appreciated. MSK: Strength and tone appear normal for age. 5/5 in all extremities Extremities: No edema. No clubbing or cyanosis. DP/PT pulses 2+ bilaterally Neuro: Alert and oriented. No focal deficits. No facial asymmetry. MAE spontaneously. Psych: Responds to questions appropriately with normal affect.    Labs    Chemistry Recent Labs  Lab 08/28/18 1410  NA 136  K 3.6  CL 101  CO2 24  GLUCOSE 101*  BUN 12  CREATININE 0.59  CALCIUM 9.6  PROT 8.8*  ALBUMIN 4.6  AST 34  ALT 52*  ALKPHOS 97  BILITOT 0.2*  GFRNONAA >60  GFRAA >60  ANIONGAP 11     Hematology Recent Labs  Lab 08/28/18 1410  WBC 11.9*  RBC 4.36  HGB 14.3  HCT 44.8  MCV 102.8*  MCH 32.8  MCHC 31.9  RDW 13.0  PLT 461*    Cardiac Enzymes Recent Labs  Lab 08/28/18 1410 08/28/18 2023 08/29/18 0239  TROPONINI <0.03 <0.03 <0.03   No results for input(s): TROPIPOC in the last 168 hours.   BNP Recent Labs  Lab 08/28/18 1410  BNP 18.0    DDimer No results for input(s): DDIMER in the last 168 hours.   Radiology    No results found.  Telemetry    08/31/18 NSR - Personally Reviewed  ECG    No new tracing as of 08/31/18 - Personally Reviewed  Cardiac Studies   ECHO: 08/29/2018 - Left ventricle: The cavity size was normal. Systolic function was normal. The estimated ejection fraction was in the range of 60% to 65%. Wall motion was normal; there were no regional wall motion abnormalities. Doppler parameters are consistent with abnormal left ventricular relaxation (grade 1 diastolic dysfunction).  Patient Profile     58 y.o. female with a hx of NASH, HTN, back/neck issues, depression, tob use, who is being seen today for the evaluation of chest pain at the request of Dr Tamala Julian.  Assessment & Plan    1. Chest pain: -Presented with exertional chest pain which had progressed to occsaional resting discomfort concerning for ACS who  presented to [facility] ED and was never seen. She then went to [facility] ED and was transferred to [facility] for further evaluation.  -Trop, <0.03, <0.03, <0.03 -EKG with no acute ST-T wave changes  -Echocardiogram 08/29/18 with 60-65% with no NWMA and G1DD -Given significant risk factors consisting of HTN, tobacco use, and family hx of premature CAD  -Continue ASA  -Will add statin today   2. HLD: -Lipid panel, LDL at 163 -Will need LFT's and repeat lipid panel in 6-8 weeks  3. Tobacco Use: -Smoking cessation strongly encouraged   4. HTN: -Stable,143/90>148/84>141/84  -Continue current regimen>>can adjust in the post cath setting    Signed, Kathyrn Drown NP-C [facility] Pager: [phone] 08/31/2018, 11:04 AM    Agree with note by Kathyrn Drown NP   Ms. Leslie Rasmussen has positive cardiac risk factors and a good story for unstable angina.  Her enzymes are negative and her EKG shows no changes.  She has had recurrent pain in the hospital.  Plan for left heart cath today.  Her exam is benign. I have reviewed the risks, indications, and alternatives to cardiac catheterization, possible angioplasty, and stenting with the patient. Risks include but are not limited to bleeding, infection, vascular injury, stroke, myocardial infection, arrhythmia, kidney injury, radiation-related injury in the case of prolonged fluoroscopy use, emergency cardiac surgery, and death. The patient understands the risks of serious complication is 1-2 in 6286 with diagnostic cardiac cath and 1-2% or less with angioplasty/stenting.   Lorretta Harp, M.D., [place], [facility], [facility] [place] [address] [place], [place]  [address]  [phone] 08/31/2018 12:19 PM  For questions or updates, please contact   Please consult [email or web address] for contact info under Cardiology/STEMI.

## 2018-09-01 ENCOUNTER — Telehealth: Payer: Self-pay | Admitting: Cardiology

## 2018-09-01 ENCOUNTER — Encounter (HOSPITAL_COMMUNITY): Payer: Self-pay | Admitting: Cardiovascular Disease

## 2018-09-01 DIAGNOSIS — I10 Essential (primary) hypertension: Secondary | ICD-10-CM

## 2018-09-01 DIAGNOSIS — Z720 Tobacco use: Secondary | ICD-10-CM

## 2018-09-01 DIAGNOSIS — I2583 Coronary atherosclerosis due to lipid rich plaque: Secondary | ICD-10-CM

## 2018-09-01 DIAGNOSIS — I2510 Atherosclerotic heart disease of native coronary artery without angina pectoris: Secondary | ICD-10-CM

## 2018-09-01 LAB — CBC
HCT: 36.7 % (ref 36.0–46.0)
Hemoglobin: 12 g/dL (ref 12.0–15.0)
MCH: 33.1 pg (ref 26.0–34.0)
MCHC: 32.7 g/dL (ref 30.0–36.0)
MCV: 101.4 fL — ABNORMAL HIGH (ref 80.0–100.0)
Platelets: 376 10*3/uL (ref 150–400)
RBC: 3.62 MIL/uL — ABNORMAL LOW (ref 3.87–5.11)
RDW: 12.4 % (ref 11.5–15.5)
WBC: 9 10*3/uL (ref 4.0–10.5)
nRBC: 0 % (ref 0.0–0.2)

## 2018-09-01 LAB — BASIC METABOLIC PANEL
Anion gap: 9 (ref 5–15)
BUN: 11 mg/dL (ref 6–20)
CO2: 23 mmol/L (ref 22–32)
Calcium: 9.6 mg/dL (ref 8.9–10.3)
Chloride: 107 mmol/L (ref 98–111)
Creatinine, Ser: 0.74 mg/dL (ref 0.44–1.00)
GFR calc Af Amer: >60 mL/min (ref >60)
GFR calc non Af Amer: >60 mL/min (ref >60)
Glucose, Bld: 110 mg/dL — ABNORMAL HIGH (ref 70–99)
Potassium: 4.1 mmol/L (ref 3.5–5.1)
Sodium: 139 mmol/L (ref 135–145)

## 2018-09-01 MED ORDER — TICAGRELOR 90 MG PO TABS: 90.0000 mg | ORAL_TABLET | Freq: Two times a day (BID) | ORAL | 0 refills | Status: DC

## 2018-09-01 MED ORDER — ATORVASTATIN CALCIUM 80 MG PO TABS: 80.0000 mg | ORAL_TABLET | Freq: Every day | ORAL | Status: DC

## 2018-09-01 MED ORDER — NICOTINE 21 MG/24HR TD PT24: 21.0000 mg | MEDICATED_PATCH | Freq: Every day | TRANSDERMAL | 0 refills | Status: DC

## 2018-09-01 MED ORDER — ATORVASTATIN CALCIUM 80 MG PO TABS: 80.0000 mg | ORAL_TABLET | Freq: Every day | ORAL | 0 refills | Status: DC

## 2018-09-01 MED ORDER — CARVEDILOL 3.125 MG PO TABS: 3.1250 mg | ORAL_TABLET | Freq: Two times a day (BID) | ORAL | 0 refills | Status: DC

## 2018-09-01 MED ORDER — ASPIRIN 81 MG PO TBEC: 81.0000 mg | DELAYED_RELEASE_TABLET | Freq: Every day | ORAL | 0 refills | Status: AC

## 2018-09-01 MED FILL — BRILINTA 90 MG TABLET: 90 | 30 days supply | Qty: 60 | Fill #0

## 2018-09-01 NOTE — Telephone Encounter (Signed)
New Message   Pt has TOC appt on 01/21 with Kerin Ransom

## 2018-09-01 NOTE — Care Management Note (Addendum)
Case Management Note  Patient Details  Name: Leslie Rasmussen MRN: [id number] Date of Birth: 10-26-1960  Subjective/Objective:    From home, s/p stent intervention,will be on brilinta, NCM informed patient of co pay amt. Vernona Rieger RN, will give patient the 30 day free coupon.  NCM informed patient of preferred [facility].                  Action/Plan: DC home when ready.   Expected Discharge Date:                  Expected Discharge Plan:  Home/Self Care  In-House Referral:     Discharge planning Services  CM Consult, Medication Assistance  Post Acute Care Choice:    Choice offered to:     DME Arranged:    DME Agency:     HH Arranged:    HH Agency:     Status of Service:  Completed, signed off  If discussed at [employer] of Stay Meetings, dates discussed:    Additional Comments:  Leone Haven, RN 09/01/2018, 10:17 AM

## 2018-09-01 NOTE — Progress Notes (Addendum)
Progress Note  Patient Name: Leslie Rasmussen Date of Encounter: 09/01/2018  Primary Cardiologist: Dr. Gwenlyn Found, MD  Subjective   Pt doing well. No chest pain. Cath site looks good   Inpatient Medications    Scheduled Meds: . amLODipine  5 mg Oral q morning - 10a  . aspirin EC  81 mg Oral Daily  . atorvastatin  80 mg Oral q1800  . buPROPion  150 mg Oral QHS  . carvedilol  3.125 mg Oral BID WC  . losartan  100 mg Oral q morning - 10a  . nicotine  21 mg Transdermal Daily  . PARoxetine  40 mg Oral q morning - 10a  . sodium chloride flush  3 mL Intravenous Q12H  . ticagrelor  90 mg Oral BID   Continuous Infusions: . sodium chloride     PRN Meds: sodium chloride, acetaminophen, cyclobenzaprine, nitroGLYCERIN, ondansetron (ZOFRAN) IV, sodium chloride flush   Vital Signs    Vitals:   08/31/18 2000 08/31/18 2002 09/01/18 0542 09/01/18 0756  BP:  126/80 138/87 138/80  Pulse:  88 82 83  Resp: 20 (!) 23 19 18   Temp:  98.4 F (36.9 C) 97.8 F (36.6 C) 98.1 F (36.7 C)  TempSrc:  Oral Oral Oral  SpO2: 93% 94% 97% 99%  Weight:   61 kg   Height:        Intake/Output Summary (Last 24 hours) at 09/01/2018 0953 Last data filed at 09/01/2018 0758 Gross per 24 hour  Intake 934.28 ml  Output 1600 ml  Net -665.72 ml   Filed Weights   08/30/18 0546 08/31/18 0459 09/01/18 0542  Weight: 62.1 kg 62.5 kg 61 kg    Physical Exam   General: Well developed, well nourished, NAD Skin: Warm, dry, intact  Head: Normocephalic, atraumatic, clear, moist mucus membranes. Neck: Negative for carotid bruits. No JVD Lungs:Clear to ausculation bilaterally. No wheezes, rales, or rhonchi. Breathing is unlabored. Cardiovascular: RRR with S1 S2. No murmurs, rubs, gallops, or LV heave appreciated. MSK: Strength and tone appear normal for age. 5/5 in all extremities Extremities: No edema. No clubbing or cyanosis. DP/PT pulses 2+ bilaterally Neuro: Alert and oriented. No focal deficits. No facial  asymmetry. MAE spontaneously. Psych: Responds to questions appropriately with normal affect.    Labs    Chemistry Recent Labs  Lab 08/28/18 1410 09/01/18 0611  NA 136 139  K 3.6 4.1  CL 101 107  CO2 24 23  GLUCOSE 101* 110*  BUN 12 11  CREATININE 0.59 0.74  CALCIUM 9.6 9.6  PROT 8.8*  --   ALBUMIN 4.6  --   AST 34  --   ALT 52*  --   ALKPHOS 97  --   BILITOT 0.2*  --   GFRNONAA >60 >60  GFRAA >60 >60  ANIONGAP 11 9     Hematology Recent Labs  Lab 08/28/18 1410 09/01/18 0611  WBC 11.9* 9.0  RBC 4.36 3.62*  HGB 14.3 12.0  HCT 44.8 36.7  MCV 102.8* 101.4*  MCH 32.8 33.1  MCHC 31.9 32.7  RDW 13.0 12.4  PLT 461* 376    Cardiac Enzymes Recent Labs  Lab 08/28/18 1410 08/28/18 2023 08/29/18 0239  TROPONINI <0.03 <0.03 <0.03   No results for input(s): TROPIPOC in the last 168 hours.   BNP Recent Labs  Lab 08/28/18 1410  BNP 18.0     DDimer No results for input(s): DDIMER in the last 168 hours.   Radiology  No results found.  Telemetry    09/01/2018 NSR - Personally Reviewed  ECG    No new tracings as of 09/01/2018 - Personally Reviewed  Cardiac Studies   Cardiac catheterization 08/31/18:   Prox LAD to Mid LAD lesion is 80% stenosed.  Mid RCA lesion is 60% stenosed.  A drug-eluting stent was successfully placed using a STENT SYNERGY DES 2.25X24.  Post intervention, there is a 0% residual stenosis.  Prox Cx lesion is 20% stenosed.  1. Moderate stenosis mid RCA. This was evaluated with a pressure wire. DFR was 0.97 suggesting the stenosis is not flow limiting.  2. Severe stenosis mid LAD. DFR of this lesion was 0.83.  3. Successful PTCA/DES x 1 mid LAD 4. Mild non-obstructive plaque in the mid Circumflex artery.   Recommendations: DAPT with ASA and Brilinta for one year.  _____________   Echocardiogram 08/29/2018: Study Conclusions  - Left ventricle: The cavity size was normal. Systolic function was normal. The  estimated ejection fraction was in the range of 60% to 65%. Wall motion was normal; there were no regional wall motion abnormalities. Doppler parameters are consistent with abnormal left ventricular relaxation (grade 1 diastolic dysfunction).  Patient Profile     58 y.o. female with medical history significant forhypertension,NASH, depression, cervical and lumbar disc disease, and tobacco use who presented to the ED with complaint of chest pain. Patient reported a 1 month history of on and off pressure-like left-sided chest pain which occured with minimal exertion such as going up a flight of stairs  Assessment & Plan    -Chest pain: -Presented with exertional chest pain which had progressed to occsaional resting discomfort concerning for ACS who presented to [facility] ED and was never seen. She then went to [facility] ED and was transferred to [facility] for further evaluation.  -On 08/31/18 the patient was taken to the cardiac cath lab which revealed moderate stenosis mid RCA. This was evaluated with a pressure wire. DFR was 0.97 suggesting the stenosis is not flow limiting. There was severe stenosis in the mid LAD in which a successful PTCA/DES was placed without complication. There was mild non-obstructive disease in the LCX. Recommendations are for DAPT with ASA and Brilinta for one year.  -Trop, <0.03, <0.03, <0.03 -EKG with no acute ST-T wave changes  -Echocardiogram 08/29/18 with 60-65% with no NWMA and G1DD  -HLD: -Lipid panel, LDL at 163 -Will need LFT's and repeat lipid panel in 6-8 weeks  -Tobacco Use: -Smoking cessation strongly encouraged   -HTN: -Stable, 138/80>138/87>126/80 -Continue current regimen>>can adjust in the post cath setting   Signed, Kathyrn Drown NP-C [facility] Pager: [phone] 09/01/2018, 9:53 AM    Agree with note by Kathyrn Drown NP  Ms. Keyes underwent LAD stenting yesterday successfully.  She had a moderate amount significant mid RCA  stenosis.  She is on dual Activella therapy.  She is clinically stable and ready for discharge home today.  I will see her back as an outpatient.   Lorretta Harp, M.D., [place], [facility], [facility] [place] [address] [place], [place]  [address]  [phone] 09/01/2018 10:43 AM   For questions or updates, please contact   Please consult [email or web address] for contact info under Cardiology/STEMI.

## 2018-09-01 NOTE — Care Management (Signed)
#    12.   S/W  EBONY  @ [employer]  #  [phone]   BRILINTA  90 MG BID COVER- YES CO-PAY-  ZERO DOLLARS ( [facility]ES [facility] ) TIER- NO PRIOR APPROVAL- NO  NO DEDUCTIBLE  PREFERRED PHARMACY : YES PATIENT [facility] USED [facility]

## 2018-09-01 NOTE — Discharge Instructions (Addendum)

## 2018-09-01 NOTE — Progress Notes (Signed)
CARDIAC REHAB PHASE I   PRE:  Rate/Rhythm: 88 SR  BP:  Supine: 138/80  Sitting:   Standing:    SaO2:   MODE:  Ambulation: 540 ft   POST:  Rate/Rhythm: 102 ST  BP:  Supine:   Sitting: 159/75  Standing:    SaO2: 99%RA 0805-0855 Pt walked 540 ft on RA with steady gait and tolerated well. No CP. Stated felt better than prior to procedure. Reviewed importance of brilinta. Reviewed NTG use, risk factors, smoking cessation, ex ed and heart healthy food choices. Gave pt smoking cessation handout and encouraged to call 1800quitnow as needed. Pt using nicotine patches now. Discussed CRP 2 and referred to [facility].   Luetta Nutting, RN BSN  09/01/2018 8:50 AM

## 2018-09-01 NOTE — Discharge Summary (Signed)
Physician Discharge Summary  Leslie Rasmussen VQM:[id number] DOB: July 10, 1961 DOA: 08/28/2018  PCP: System, Pcp Not In  Admit date: 08/28/2018 Discharge date: 09/01/2018  Admitted From: Home Disposition: Home  Recommendations for Outpatient Follow-up:  1. Follow up with PCP in 1-2 weeks 2. Please obtain BMP/CBC in one week your next doctors visit.  3. Follow-up outpatient with cardiology-Dr. Elton Sin to be made by their service  Home Health: None Equipment/Devices: None Discharge Condition: Stable CODE STATUS: Full code Diet recommendation: Heart healthy  Brief/Interim Summary: 58 year old female with history of Leslie Rasmussen, depression, tobacco use, essential hypertension came to the hospital with complains of exertional shortness of breath and chest discomfort which was relieved by rest.  She has had the symptoms for some time now and occurs several times throughout the week.  She initially went to [facility] ER and there was transferred to any pain ER and then finally to [facility].  Underwent left heart catheterization on 08/31/2018 which showed moderate stenosis of mid RCA and severe stenosis of mid LAD requiring drug-eluting stent placement.  Aspirin and Brilinta was recommended to be taken for 1 year. Echocardiogram prior to left heart catheterization showed ejection fraction 60 to 65% with grade 1 diastolic dysfunction Today patient is chest pain-free and feels much better, will be discharging her in stable condition with outpatient follow-up recommendations as stated above.   Discharge Diagnoses:  Principal Problem:   Chest pain Active Problems:   NASH (nonalcoholic steatohepatitis)   Hypertension   Depression   Tobacco use   Unstable angina (HCC)   CAD (coronary artery disease)  Chest pain, resolved Coronary artery disease status post PCI - Echocardiogram showed ejection fraction 60 to 65% with grade 1 diastolic dysfunction.  Left heart catheterization performed on 1/13 showed  severe stenosis of mid LAD requiring drug-eluting stent.  Started on aspirin and Brilinta for 1 year. -Also started on statin, beta-blocker; Losartan  Hyperlipidemia -LDL is 163, atorvastatin 80 mg daily  Essential hypertension -Continue Norvasc 5 mg daily, Coreg 3.125 mg twice daily, losartan 100 mg daily  Active tobacco use -Counseled to quit smoking.  Nicotine patch prescribed  Discharge patient today in stable condition.  Discharge Instructions  Discharge Instructions    Amb Referral to Cardiac Rehabilitation   Complete by:  As directed    Referring to [facility] CRP 2   Diagnosis:  Coronary Stents     Allergies as of 09/01/2018      Reactions   Crab [shellfish Allergy] Other (See Comments)   Migraines      Medication List    TAKE these medications   amLODipine 5 MG tablet Commonly known as:  NORVASC Take 5 mg by mouth every morning.   aspirin 81 MG EC tablet Take 1 tablet (81 mg total) by mouth daily for 30 days. Start taking on:  September 02, 2018   atorvastatin 80 MG tablet Commonly known as:  LIPITOR Take 1 tablet (80 mg total) by mouth daily at 6 PM for 30 days.   buPROPion 150 MG 12 hr tablet Commonly known as:  WELLBUTRIN SR Take 150 mg by mouth at bedtime.   carvedilol 3.125 MG tablet Commonly known as:  COREG Take 1 tablet (3.125 mg total) by mouth 2 (two) times daily with a meal for 30 days.   cyclobenzaprine 10 MG tablet Commonly known as:  FLEXERIL Take 10 mg by mouth 3 (three) times daily as needed for muscle spasms.   losartan 100 MG tablet Commonly known as:  COZAAR  Take 100 mg by mouth every morning.   nicotine 21 mg/24hr patch Commonly known as:  NICODERM CQ - dosed in mg/24 hours Place 1 patch (21 mg total) onto the skin daily. Start taking on:  September 02, 2018   PARoxetine 40 MG tablet Commonly known as:  PAXIL Take 40 mg by mouth every morning.   ticagrelor 90 MG Tabs tablet Commonly known as:  BRILINTA Take 1 tablet (90 mg  total) by mouth 2 (two) times daily for 30 days.      Follow-up Information    Erlene Quan, PA-C Follow up on 09/08/2018.   Specialties:  Cardiology, Radiology Why:  Your follow up appointment will be on 09/08/18 at 0830am.  Contact information: [address] [place] [address] [phone]          Allergies  Allergen Reactions  . Crab [Shellfish Allergy] Other (See Comments)    Migraines     You were cared for by a hospitalist during your hospital stay. If you have any questions about your discharge medications or the care you received while you were in the hospital after you are discharged, you can call the unit and asked to speak with the hospitalist on call if the hospitalist that took care of you is not available. Once you are discharged, your primary care physician will handle any further medical issues. Please note that no refills for any discharge medications will be authorized once you are discharged, as it is imperative that you return to your primary care physician (or establish a relationship with a primary care physician if you do not have one) for your aftercare needs so that they can reassess your need for medications and monitor your lab values.  Consultations:  Cardiology   Procedures/Studies: Dg Chest 2 View  Result Date: 08/28/2018 CLINICAL DATA:  Chest pain.  Shortness of breath. EXAM: CHEST - 2 VIEW COMPARISON:  None. FINDINGS: The heart size and mediastinal contours are within normal limits. Both lungs are clear. The visualized skeletal structures are unremarkable. IMPRESSION: No active cardiopulmonary disease. Electronically Signed   By: Lorriane Shire M.D.   On: 08/28/2018 14:45      Subjective: No complaints, feels better.   Discharge Exam: Vitals:   09/01/18 0542 09/01/18 0756  BP: 138/87 138/80  Pulse: 82 83  Resp: 19 18  Temp: 97.8 F (36.6 C) 98.1 F (36.7 C)  SpO2: 97% 99%   Vitals:   08/31/18 2000 08/31/18 2002  09/01/18 0542 09/01/18 0756  BP:  126/80 138/87 138/80  Pulse:  88 82 83  Resp: 20 (!) 23 19 18   Temp:  98.4 F (36.9 C) 97.8 F (36.6 C) 98.1 F (36.7 C)  TempSrc:  Oral Oral Oral  SpO2: 93% 94% 97% 99%  Weight:   61 kg   Height:        General: Pt is alert, awake, not in acute distress Cardiovascular: RRR, S1/S2 +, no rubs, no gallops Respiratory: CTA bilaterally, no wheezing, no rhonchi Abdominal: Soft, NT, ND, bowel sounds + Extremities: no edema, no cyanosis    The results of significant diagnostics from this hospitalization (including imaging, microbiology, ancillary and laboratory) are listed below for reference.     Microbiology: No results found for this or any previous visit (from the past 240 hour(s)).   Labs: BNP (last 3 results) Recent Labs    08/28/18 1410  BNP 19.7   Basic Metabolic Panel: Recent Labs  Lab 08/28/18 1410 09/01/18  4628  NA 136 139  K 3.6 4.1  CL 101 107  CO2 24 23  GLUCOSE 101* 110*  BUN 12 11  CREATININE 0.59 0.74  CALCIUM 9.6 9.6   Liver Function Tests: Recent Labs  Lab 08/28/18 1410  AST 34  ALT 52*  ALKPHOS 97  BILITOT 0.2*  PROT 8.8*  ALBUMIN 4.6   No results for input(s): LIPASE, AMYLASE in the last 168 hours. No results for input(s): AMMONIA in the last 168 hours. CBC: Recent Labs  Lab 08/28/18 1410 09/01/18 0611  WBC 11.9* 9.0  NEUTROABS 4.8  --   HGB 14.3 12.0  HCT 44.8 36.7  MCV 102.8* 101.4*  PLT 461* 376   Cardiac Enzymes: Recent Labs  Lab 08/28/18 1410 08/28/18 2023 08/29/18 0239  TROPONINI <0.03 <0.03 <0.03   BNP: Invalid input(s): POCBNP CBG: No results for input(s): GLUCAP in the last 168 hours. D-Dimer No results for input(s): DDIMER in the last 72 hours. Hgb A1c No results for input(s): HGBA1C in the last 72 hours. Lipid Profile Recent Labs    08/31/18 0434  CHOL 284*  HDL 43  LDLCALC 163*  TRIG 390*  CHOLHDL 6.6   Thyroid function studies No results for input(s): TSH,  T4TOTAL, T3FREE, THYROIDAB in the last 72 hours.  Invalid input(s): FREET3 Anemia work up No results for input(s): VITAMINB12, FOLATE, FERRITIN, TIBC, IRON, RETICCTPCT in the last 72 hours. Urinalysis    Component Value Date/Time   COLORURINE STRAW (A) 08/28/2018 1442   APPEARANCEUR CLEAR 08/28/2018 1442   LABSPEC 1.002 (L) 08/28/2018 1442   PHURINE 6.0 08/28/2018 1442   GLUCOSEU NEGATIVE 08/28/2018 1442   HGBUR SMALL (A) 08/28/2018 1442   BILIRUBINUR NEGATIVE 08/28/2018 1442   KETONESUR NEGATIVE 08/28/2018 1442   PROTEINUR NEGATIVE 08/28/2018 1442   NITRITE NEGATIVE 08/28/2018 1442   LEUKOCYTESUR TRACE (A) 08/28/2018 1442   Sepsis Labs Invalid input(s): PROCALCITONIN,  WBC,  LACTICIDVEN Microbiology No results found for this or any previous visit (from the past 240 hour(s)).   Time coordinating discharge:  I have spent 35 minutes face to face with the patient and on the ward discussing the patients care, assessment, plan and disposition with other care givers. >50% of the time was devoted counseling the patient about the risks and benefits of treatment/Discharge disposition and coordinating care.   SIGNED:   Damita Lack, MD  [facility] 09/01/2018, 10:58 AM   If 7PM-7AM, please contact night-coverage [email or web address]

## 2018-09-01 NOTE — Telephone Encounter (Signed)
Currently admitted 1/14

## 2018-09-02 NOTE — Telephone Encounter (Signed)
Attempted to contact pt x 1. Unable to leave message as mailbox is full.

## 2018-09-02 NOTE — Telephone Encounter (Signed)
Patient contacted regarding discharge from [facility] on 09/01/18.  Patient understands to follow up with provider Leslie [facility] on 09/08/18 at 830am at [facility]. Patient understands discharge instructions? yes Patient understands medications and regiment? yes Patient understands to bring all medications to this visit? yes

## 2018-09-08 ENCOUNTER — Ambulatory Visit: Payer: Medicaid - Out of State | Admitting: Cardiology

## 2018-09-15 ENCOUNTER — Ambulatory Visit: Payer: Medicaid - Out of State | Admitting: Cardiology

## 2018-09-15 ENCOUNTER — Encounter: Payer: Self-pay | Admitting: Cardiology

## 2018-09-15 VITALS — BP 122/72 | HR 83 | Ht 63.0 in | Wt 138.0 lb

## 2018-09-15 DIAGNOSIS — E785 Hyperlipidemia, unspecified: Secondary | ICD-10-CM | POA: Insufficient documentation

## 2018-09-15 DIAGNOSIS — I208 Other forms of angina pectoris: Secondary | ICD-10-CM

## 2018-09-15 DIAGNOSIS — I10 Essential (primary) hypertension: Secondary | ICD-10-CM

## 2018-09-15 DIAGNOSIS — I2510 Atherosclerotic heart disease of native coronary artery without angina pectoris: Secondary | ICD-10-CM | POA: Diagnosis not present

## 2018-09-15 DIAGNOSIS — Z9861 Coronary angioplasty status: Secondary | ICD-10-CM | POA: Diagnosis not present

## 2018-09-15 DIAGNOSIS — Z720 Tobacco use: Secondary | ICD-10-CM | POA: Diagnosis not present

## 2018-09-15 MED ORDER — ISOSORBIDE MONONITRATE ER 30 MG PO TB24: 30.0000 mg | ORAL_TABLET | Freq: Every day | ORAL | 2 refills | Status: DC

## 2018-09-15 NOTE — Assessment & Plan Note (Signed)
Unclear if this is angina or not. Its somewhat atypical in that its not exertional. She now says her symptoms didn't change post PCI.

## 2018-09-15 NOTE — Progress Notes (Signed)
09/15/2018 [name]   1961/07/30  [id number]  Primary Physician System, Pcp Not In Primary Cardiologist: Dr Allyson Sabal  HPI: Ms. Leslie Rasmussen is a 58 year old female from [place] who presented to an urgent care in [place] on January 9.  She been having exertional chest pain and shortness of breath and left arm pain for about a month.  They wanted to send her to [facility] via EMS but she insisted on driving over.  When she got to the emergency room at [facility] she had to wait for 2 hours.  She finally went home without treatment.  She then went to [facility] the next day and was admitted and transferred to [facility] with unstable angina.  Her troponins were negative.  She underwent coronary angiogram August 31, 2018.  This revealed a 60% RCA mid vessel and an 80% LAD after the takeoff of the first diagonal.  She did have FFR of the RCA and it was not felt to be significant.  She underwent intervention of the LAD with a DES.  Echocardiogram showed an EF of 60 to 65%.  She is in the office today for follow-up.  Her husband accompanied her.  Unfortunately since discharge she says her symptoms are pretty much the same.  She has shortness of breath chest pain and left arm pain.  It is not always exertional.  Sometimes it is at night when she lays down.  Her EKG is normal.  She appears comfortable.   Current Outpatient Medications  Medication Sig Dispense Refill  . amLODipine (NORVASC) 5 MG tablet Take 5 mg by mouth every morning.    Marland Kitchen aspirin EC 81 MG EC tablet Take 1 tablet (81 mg total) by mouth daily for 30 days. 30 tablet 0  . atorvastatin (LIPITOR) 80 MG tablet Take 1 tablet (80 mg total) by mouth daily at 6 PM for 30 days. 30 tablet 0  . buPROPion (WELLBUTRIN SR) 150 MG 12 hr tablet Take 150 mg by mouth at bedtime.    . carvedilol (COREG) 3.125 MG tablet Take 1 tablet (3.125 mg total) by mouth 2 (two) times daily with a meal for 30 days. 60 tablet 0  . cyclobenzaprine (FLEXERIL)  10 MG tablet Take 10 mg by mouth 3 (three) times daily as needed for muscle spasms.    Marland Kitchen losartan (COZAAR) 100 MG tablet Take 100 mg by mouth every morning.    . nicotine (NICODERM CQ - DOSED IN MG/24 HOURS) 21 mg/24hr patch Place 1 patch (21 mg total) onto the skin daily. 28 patch 0  . PARoxetine (PAXIL) 40 MG tablet Take 40 mg by mouth every morning.    . ticagrelor (BRILINTA) 90 MG TABS tablet Take 1 tablet (90 mg total) by mouth 2 (two) times daily for 30 days. 60 tablet 0  . isosorbide mononitrate (IMDUR) 30 MG 24 hr tablet Take 1 tablet (30 mg total) by mouth daily. 30 tablet 2   No current facility-administered medications for this visit.     Allergies  Allergen Reactions  . Crab [Shellfish Allergy] Other (See Comments)    Migraines     Past Medical History:  Diagnosis Date  . Depression   . Herniated disc, cervical   . Hypertension   . Lumbar herniated disc   . NASH (nonalcoholic steatohepatitis)   . Spondylolysis     Social History   Socioeconomic History  . Marital status: Married    Spouse name: Not on file  . Number  of children: Not on file  . Years of education: Not on file  . Highest education level: Not on file  Occupational History  . Occupation: Retired  Engineer, productionocial Needs  . Financial resource strain: Not on file  . Food insecurity:    Worry: Not on file    Inability: Not on file  . Transportation needs:    Medical: Not on file    Non-medical: Not on file  Tobacco Use  . Smoking status: Current Every Day Smoker    Packs/day: 1.00    Years: 45.00    Pack years: 45.00    Types: Cigarettes  . Smokeless tobacco: Never Used  Substance and Sexual Activity  . Alcohol use: Yes    Frequency: Never    Comment: occasionally   . Drug use: Never  . Sexual activity: Not on file  Lifestyle  . Physical activity:    Days per week: Not on file    Minutes per session: Not on file  . Stress: Not on file  Relationships  . Social connections:    Talks on phone:  Not on file    Gets together: Not on file    Attends religious service: Not on file    Active member of club or organization: Not on file    Attends meetings of clubs or organizations: Not on file    Relationship status: Not on file  . Intimate partner violence:    Fear of current or ex partner: Not on file    Emotionally abused: Not on file    Physically abused: Not on file    Forced sexual activity: Not on file  Other Topics Concern  . Not on file  Social History Narrative  . Not on file     Family History  Problem Relation Age of Onset  . Angina Father 2455       Lived to 3989  . Aortic stenosis Father   . Aortic stenosis Sister   . Heart disease Sister        has stents, dx late 10940s, early 5850s  . Stroke Mother        Died at 1179  . Aortic stenosis Brother      Review of Systems: General: negative for chills, fever, night sweats or weight changes.  Cardiovascular: negative for edema, orthopnea, palpitations, paroxysmal nocturnal dyspnea or shortness of breath Dermatological: negative for rash Respiratory: negative for cough or wheezing Urologic: negative for hematuria Abdominal: negative for nausea, vomiting, diarrhea, bright red blood per rectum, melena, or hematemesis Neurologic: negative for visual changes, syncope, or dizziness All other systems reviewed and are otherwise negative except as noted above.    Blood pressure 122/72, pulse 83, height 5\' 3"  (1.6 m), weight 138 lb (62.6 kg).  General appearance: alert, cooperative, no distress and poor dentition Neck: no JVD Lungs: clear to auscultation bilaterally Heart: regular rate and rhythm Extremities: Rt radial site without hematoma Skin: warm and dry Neurologic: Grossly normal  EKG NSR- HR 82  ASSESSMENT AND PLAN:   Chest pain Unclear if this is angina or not. Its somewhat atypical in that its not exertional. She now says her symptoms didn't change post PCI.  Essential hypertension Controlled  Tobacco  use Discussed the importance of smoking cessation  Dyslipidemia, goal LDL below 70 LDL was 163- discharged on high dose statin Rx   PLAN  Add Imdur 15 mg x 3 daily, then increase to 30 mg daily.  F/U in a few weeks.  If she is still complaining of chest pain consider Lexiscan to R/O significant RCA disease. Check lipids and CMET in 6 weeks.   Corine Shelter PA-C 09/15/2018 2:32 PM

## 2018-09-15 NOTE — Patient Instructions (Signed)
Medication Instructions:   START IMDUR TAKE 15 MG (HALF TABLET) FOR 3 DAYS THEN TAKE 30 MG (1 TABLET) DAILY  If you need a refill on your cardiac medications before your next appointment, please call your pharmacy.   Lab work: NONE  If you have labs (blood work) drawn today and your tests are completely normal, you will receive your results only by: Marland Kitchen MyChart Message (if you have MyChart) OR . A paper copy in the mail If you have any lab test that is abnormal or we need to change your treatment, we will call you to review the results.  Testing/Procedures:  NONE  Follow-Up: At [facility], you and your health needs are our priority.  As part of our continuing mission to provide you with exceptional heart care, we have created designated Provider Care Teams.  These Care Teams include your primary Cardiologist (physician) and Advanced Practice Providers (APPs -  Physician Assistants and Nurse Practitioners) who all work together to provide you with the care you need, when you need it. You will need a follow up appointment in 4 weeks with  Nanetta Batty, MD or Corine Shelter, PA-C

## 2018-09-15 NOTE — Assessment & Plan Note (Signed)
Controlled.  

## 2018-09-15 NOTE — Assessment & Plan Note (Signed)
Discussed the importance of smoking cessation. 

## 2018-09-15 NOTE — Assessment & Plan Note (Signed)
LDL was 163- discharged on high dose statin Rx

## 2018-09-30 ENCOUNTER — Ambulatory Visit: Payer: Medicaid - Out of State | Admitting: Adult Health

## 2018-10-01 ENCOUNTER — Telehealth: Payer: Self-pay | Admitting: Cardiovascular Disease

## 2018-10-01 MED ORDER — ISOSORBIDE MONONITRATE ER 30 MG PO TB24: 30.0000 mg | ORAL_TABLET | Freq: Every day | ORAL | 2 refills | Status: DC

## 2018-10-01 MED ORDER — LOSARTAN POTASSIUM 100 MG PO TABS: 100.0000 mg | ORAL_TABLET | Freq: Every morning | ORAL | 3 refills | Status: DC

## 2018-10-01 MED ORDER — ATORVASTATIN CALCIUM 80 MG PO TABS: 80.0000 mg | ORAL_TABLET | Freq: Every day | ORAL | 3 refills | Status: DC

## 2018-10-01 MED ORDER — TICAGRELOR 90 MG PO TABS: 90.0000 mg | ORAL_TABLET | Freq: Two times a day (BID) | ORAL | 3 refills | Status: DC

## 2018-10-01 MED ORDER — AMLODIPINE BESYLATE 5 MG PO TABS: 5.0000 mg | ORAL_TABLET | Freq: Every morning | ORAL | 3 refills | Status: DC

## 2018-10-01 MED ORDER — CARVEDILOL 3.125 MG PO TABS: 3.1250 mg | ORAL_TABLET | Freq: Two times a day (BID) | ORAL | 3 refills | Status: DC

## 2018-10-01 NOTE — Telephone Encounter (Signed)
Spoke with pt, aware to continue all medications she was taking when seen 09-15-18. Refill sent to the pharmacy electronically.

## 2018-10-01 NOTE — Telephone Encounter (Signed)
New Message   Patient had catherization and stent placed in LED artery and was prescribed baby aspirin, atorvastatin 73m, Carvedilol 3.1215m and Ticagrelor 909m She was instructed to take for 30 days but has more left and also refills and wants to know if she should continue to take them.

## 2018-10-07 ENCOUNTER — Ambulatory Visit: Payer: Medicaid - Out of State | Admitting: Adult Health

## 2018-10-13 ENCOUNTER — Ambulatory Visit (INDEPENDENT_AMBULATORY_CARE_PROVIDER_SITE_OTHER): Payer: Medicaid - Out of State | Admitting: Cardiovascular Disease

## 2018-10-13 ENCOUNTER — Telehealth: Payer: Self-pay

## 2018-10-13 ENCOUNTER — Encounter: Payer: Self-pay | Admitting: Cardiovascular Disease

## 2018-10-13 VITALS — BP 129/75 | HR 90 | Ht 62.0 in | Wt 142.0 lb

## 2018-10-13 DIAGNOSIS — M79602 Pain in left arm: Secondary | ICD-10-CM

## 2018-10-13 NOTE — Patient Instructions (Signed)
Medication Instructions:  Your physician recommends that you continue on your current medications as directed. Please refer to the Current Medication list given to you today.  If you need a refill on your cardiac medications before your next appointment, please call your pharmacy.   Lab work: NONE If you have labs (blood work) drawn today and your tests are completely normal, you will receive your results only by: Marland Kitchen MyChart Message (if you have MyChart) OR . A paper copy in the mail If you have any lab test that is abnormal or we need to change your treatment, we will call you to review the results.  Testing/Procedures: NONE  Follow-Up: At [facility], you and your health needs are our priority.  As part of our continuing mission to provide you with exceptional heart care, we have created designated Provider Care Teams.  These Care Teams include your primary Cardiologist (physician) and Advanced Practice Providers (APPs -  Physician Assistants and Nurse Practitioners) who all work together to provide you with the care you need, when you need it.

## 2018-10-13 NOTE — Telephone Encounter (Signed)
Pt refused AVS during office visit

## 2018-10-13 NOTE — Progress Notes (Signed)
I saw Ms. Leslie Rasmussen in the office today as a post hospital follow-up.  She had recent stent to her LAD by Dr. Angelena Form 08/31/2018 with a negative FFR of her RCA.  She does have a history of tobacco abuse, hypertension hyperlipidemia.  She saw Kerin Ransom in the office 09/07/2018 with ongoing complaints of left arm pain which is fairly constant.  I reassured her that it is unlikely that this is related to her heart but she was fairly aggressive and combative.  At the end of our conversation I did not feel that I was able to provide her with adequate care and terminated the visit.  Her husband was present during the visit.  I suggested that she see her primary care physician for further evaluation of chronic left arm pain.  Lorretta Harp, M.D., [place], [facility], [place] [address] [place], [place]  [address]  [phone] 10/13/2018 3:43 PM

## 2019-01-02 ENCOUNTER — Encounter (HOSPITAL_COMMUNITY): Payer: Self-pay | Admitting: Emergency Medicine

## 2019-01-02 ENCOUNTER — Emergency Department (HOSPITAL_COMMUNITY): Payer: Medicaid - Out of State

## 2019-01-02 ENCOUNTER — Other Ambulatory Visit: Payer: Self-pay

## 2019-01-02 ENCOUNTER — Emergency Department (HOSPITAL_COMMUNITY)
Admission: EM | Admit: 2019-01-02 | Discharge: 2019-01-02 | Disposition: A | Discharge status: Home / Self Care | Payer: Medicaid - Out of State | Attending: Emergency Medicine | Admitting: Emergency Medicine

## 2019-01-02 DIAGNOSIS — I2510 Atherosclerotic heart disease of native coronary artery without angina pectoris: Secondary | ICD-10-CM | POA: Diagnosis not present

## 2019-01-02 DIAGNOSIS — I10 Essential (primary) hypertension: Secondary | ICD-10-CM | POA: Insufficient documentation

## 2019-01-02 DIAGNOSIS — Z7982 Long term (current) use of aspirin: Secondary | ICD-10-CM | POA: Diagnosis not present

## 2019-01-02 DIAGNOSIS — Z79899 Other long term (current) drug therapy: Secondary | ICD-10-CM | POA: Insufficient documentation

## 2019-01-02 DIAGNOSIS — R079 Chest pain, unspecified: Secondary | ICD-10-CM | POA: Diagnosis not present

## 2019-01-02 DIAGNOSIS — F17210 Nicotine dependence, cigarettes, uncomplicated: Secondary | ICD-10-CM | POA: Diagnosis not present

## 2019-01-02 DIAGNOSIS — K760 Fatty (change of) liver, not elsewhere classified: Secondary | ICD-10-CM | POA: Insufficient documentation

## 2019-01-02 LAB — COMPREHENSIVE METABOLIC PANEL
ALT: 37 U/L (ref 0–44)
AST: 33 U/L (ref 15–41)
Albumin: 4.3 g/dL (ref 3.5–5.0)
Alkaline Phosphatase: 131 U/L — ABNORMAL HIGH (ref 38–126)
Anion gap: 16 — ABNORMAL HIGH (ref 5–15)
BUN: 12 mg/dL (ref 6–20)
CO2: 18 mmol/L — ABNORMAL LOW (ref 22–32)
Calcium: 9.2 mg/dL (ref 8.9–10.3)
Chloride: 99 mmol/L (ref 98–111)
Creatinine, Ser: 0.7 mg/dL (ref 0.44–1.00)
GFR calc Af Amer: >60 mL/min (ref >60)
GFR calc non Af Amer: >60 mL/min (ref >60)
Glucose, Bld: 102 mg/dL — ABNORMAL HIGH (ref 70–99)
Potassium: 3.3 mmol/L — ABNORMAL LOW (ref 3.5–5.1)
Sodium: 133 mmol/L — ABNORMAL LOW (ref 135–145)
Total Bilirubin: 0.9 mg/dL (ref 0.3–1.2)
Total Protein: 7.5 g/dL (ref 6.5–8.1)

## 2019-01-02 LAB — CBC
HCT: 38.8 % (ref 36.0–46.0)
Hemoglobin: 13 g/dL (ref 12.0–15.0)
MCH: 33 pg (ref 26.0–34.0)
MCHC: 33.5 g/dL (ref 30.0–36.0)
MCV: 98.5 fL (ref 80.0–100.0)
Platelets: 441 10*3/uL — ABNORMAL HIGH (ref 150–400)
RBC: 3.94 MIL/uL (ref 3.87–5.11)
RDW: 13.1 % (ref 11.5–15.5)
WBC: 11.8 10*3/uL — ABNORMAL HIGH (ref 4.0–10.5)
nRBC: 0 % (ref 0.0–0.2)

## 2019-01-02 LAB — CBG MONITORING, ED: Glucose-Capillary: 93 mg/dL (ref 70–99)

## 2019-01-02 LAB — TROPONIN I: Troponin I: <0.03 ng/mL (ref <0.03)

## 2019-01-02 MED ORDER — NICOTINE 14 MG/24HR TD PT24
14.0000 mg | MEDICATED_PATCH | Freq: Once | TRANSDERMAL | Status: DC
  Filled 2019-01-02: qty 1

## 2019-01-02 MED ORDER — ASPIRIN 81 MG PO CHEW
324.0000 mg | CHEWABLE_TABLET | Freq: Once | ORAL | Status: AC
  Administered 2019-01-02: 324 mg via ORAL
  Filled 2019-01-02: qty 4

## 2019-01-02 MED ORDER — ONDANSETRON 8 MG PO TBDP: 8.0000 mg | ORAL_TABLET | Freq: Three times a day (TID) | ORAL | 0 refills | Status: DC | PRN

## 2019-01-02 MED ORDER — ONDANSETRON HCL 4 MG/2ML IJ SOLN
4.0000 mg | Freq: Once | INTRAMUSCULAR | Status: AC
  Administered 2019-01-02: 17:00:00 4 mg via INTRAVENOUS
  Filled 2019-01-02: qty 2

## 2019-01-02 NOTE — ED Notes (Signed)
Pt had one episode of vomiting. No blood noted in emesis. Emesis was yellow/green and clear.   Pt speaking w/ daughter at this time.

## 2019-01-02 NOTE — ED Provider Notes (Signed)
[facility] EMERGENCY DEPARTMENT Provider Note   CSN: [id number] Arrival date & time: 01/02/19  1532    History   Chief Complaint Chest pain  HPI Leslie Rasmussen is a 58 y.o. female.     HPI Pt has been having trouble with chest pain and arm pain since January.  Pt has seen her cardiologist for this since then and was told sx were not related to her heart.  Pt disagrees and worries that her heart is causing her trouble.  Pt states she has a history of a blockage and these sx feel similar.  She is concerned because in the past she has normal EKGs.  Pt had an episode of vomiting yesterday.  It was dark in color.  She has noticed some dark stools. No bright red blood.  The pain is constant every day.  The pain is in her chest and left arm.   It is daily and constant.  Nothing seems to make it any better. Past Medical History:  Diagnosis Date   Depression    Herniated disc, cervical    Hypertension    Lumbar herniated disc    NASH (nonalcoholic steatohepatitis)    Spondylolysis     Patient Active Problem List   Diagnosis Date Noted   Dyslipidemia, goal LDL below 70 09/15/2018   CAD S/P PCI 08/31/2018   Unstable angina (HCC)    Chest pain 08/28/2018   NASH (nonalcoholic steatohepatitis) 08/28/2018   Essential hypertension 08/28/2018   Depression 08/28/2018   Tobacco use 08/28/2018    Past Surgical History:  Procedure Laterality Date   CHOLECYSTECTOMY     CORONARY STENT INTERVENTION N/A 08/31/2018   Procedure: CORONARY STENT INTERVENTION;  Surgeon: Burnell Blanks, MD;  Location: [place] [facility];  Service: Cardiovascular;  Laterality: N/A;   INTRAVASCULAR PRESSURE WIRE/FFR STUDY N/A 08/31/2018   Procedure: INTRAVASCULAR PRESSURE WIRE/FFR STUDY;  Surgeon: Burnell Blanks, MD;  Location: [place] [facility];  Service: Cardiovascular;  Laterality: N/A;   INTRAVASCULAR ULTRASOUND/IVUS N/A 08/31/2018   Procedure: Intravascular  Ultrasound/IVUS;  Surgeon: Burnell Blanks, MD;  Location: [place] [facility];  Service: Cardiovascular;  Laterality: N/A;   LEFT HEART CATH AND CORONARY ANGIOGRAPHY N/A 08/31/2018   Procedure: LEFT HEART CATH AND CORONARY ANGIOGRAPHY;  Surgeon: Burnell Blanks, MD;  Location: [place] [facility];  Service: Cardiovascular;  Laterality: N/A;     OB History   No obstetric history on file.      Home Medications    Prior to Admission medications   Medication Sig Start Date End Date Taking? Authorizing Provider  amLODipine (NORVASC) 5 MG tablet Take 1 tablet (5 mg total) by mouth every morning. 10/01/18   Lorretta Harp, MD  aspirin 81 MG tablet Take 81 mg by mouth daily.    [provider]  atorvastatin (LIPITOR) 80 MG tablet Take 1 tablet (80 mg total) by mouth daily at 6 PM for 30 days. 10/01/18 10/31/18  Lorretta Harp, MD  buPROPion (WELLBUTRIN SR) 150 MG 12 hr tablet Take 150 mg by mouth at bedtime.    [provider]  carvedilol (COREG) 3.125 MG tablet Take 1 tablet (3.125 mg total) by mouth 2 (two) times daily with a meal. 10/01/18   Lorretta Harp, MD  cyclobenzaprine (FLEXERIL) 10 MG tablet Take 10 mg by mouth 3 (three) times daily as needed for muscle spasms.    [provider]  isosorbide mononitrate (IMDUR) 30 MG 24  hr tablet Take 1 tablet (30 mg total) by mouth daily. 10/01/18 12/30/18  Lorretta Harp, MD  losartan (COZAAR) 100 MG tablet Take 1 tablet (100 mg total) by mouth every morning. 10/01/18   Lorretta Harp, MD  nicotine (NICODERM CQ - DOSED IN MG/24 HOURS) 21 mg/24hr patch Place 1 patch (21 mg total) onto the skin daily. 09/02/18   Amin, Jeanella Flattery, MD  ondansetron (ZOFRAN ODT) 8 MG disintegrating tablet Take 1 tablet (8 mg total) by mouth every 8 (eight) hours as needed for nausea or vomiting. 01/02/19   Dorie Rank, MD  PARoxetine (PAXIL) 40 MG tablet Take 40 mg by mouth every morning.    [provider]    ticagrelor (BRILINTA) 90 MG TABS tablet Take 1 tablet (90 mg total) by mouth 2 (two) times daily. 10/01/18   Lorretta Harp, MD    Family History Family History  Problem Relation Age of Onset   Angina Father 38       Lived to 71   Aortic stenosis Father    Aortic stenosis Sister    Heart disease Sister        has stents, dx late 24s, early 67s   Stroke Mother        Died at 44   Aortic stenosis Brother     Social History Social History   Tobacco Use   Smoking status: Current Every Day Smoker    Packs/day: 0.50    Years: 45.00    Pack years: 22.50    Types: Cigarettes   Smokeless tobacco: Never Used  Substance Use Topics   Alcohol use: Yes    Frequency: Never    Comment: occasionally    Drug use: Never     Allergies   Crab [shellfish allergy]   Review of Systems Review of Systems   Physical Exam Updated Vital Signs BP 110/69    Pulse 72    Temp 98.6 F (37 C) (Oral)    Resp 20    SpO2 97%   Physical Exam   ED Treatments / Results  Labs (all labs ordered are listed, but only abnormal results are displayed) Labs Reviewed  CBC - Abnormal; Notable for the following components:      Result Value   WBC 11.8 (*)    Platelets 441 (*)    All other components within normal limits  COMPREHENSIVE METABOLIC PANEL - Abnormal; Notable for the following components:   Sodium 133 (*)    Potassium 3.3 (*)    CO2 18 (*)    Glucose, Bld 102 (*)    Alkaline Phosphatase 131 (*)    Anion gap 16 (*)    All other components within normal limits  TROPONIN I  TROPONIN I  CBG MONITORING, ED    EKG EKG Interpretation  Date/Time:  Saturday Jan 02 2019 15:47:12 EDT Ventricular Rate:  89 PR Interval:    QRS Duration: 106 QT Interval:  397 QTC Calculation: 484 R Axis:   61 Text Interpretation:  Sinus rhythm Low voltage, precordial leads No old tracing to compare Confirmed by Dorie Rank [phone]) on 01/02/2019 3:57:00 PM   Radiology US Abdomen  Complete  Result Date: 01/02/2019 CLINICAL DATA:  58 year old presenting with chest discomfort and elevated liver function test. Surgical history includes cholecystectomy. Personal history of non alcoholic steatohepatitis. EXAM: ABDOMEN ULTRASOUND COMPLETE COMPARISON:  None. FINDINGS: Gallbladder: Surgically absent. Common bile duct: Diameter: Approximately 7 mm. Liver: Diffusely increased and coarsened echotexture without focal  hepatic parenchymal abnormality. Portal vein is patent on color Doppler imaging with normal direction of blood flow towards the liver. IVC: Patent. Pancreas: Suboptimally imaged due to overlying bowel gas. Spleen: Normal size and echotexture without focal parenchymal abnormality. Right Kidney: Length: Approximately 10.4 cm. No hydronephrosis. Well-preserved cortex. No shadowing calculi. Normal parenchymal echotexture. No focal parenchymal abnormality. Left Kidney: Length: Approximately 10.8 cm. No hydronephrosis. Well-preserved cortex. No shadowing calculi. Normal parenchymal echotexture. No focal parenchymal abnormality. Abdominal aorta: Normal in caliber proximally. Obscured in its mid and distal portion due to overlying bowel gas. Other findings: None. IMPRESSION: 1. Diffuse hepatic steatosis and/or hepatocellular disease without focal hepatic parenchymal abnormality. 2. Otherwise normal examination post cholecystectomy with a caveat that the pancreas and the mid and distal abdominal aorta were obscured by overlying bowel gas and therefore not evaluated. Electronically Signed   By: Evangeline Dakin M.D.   On: 01/02/2019 18:28   Dg Chest Portable 1 View  Result Date: 01/02/2019 CLINICAL DATA:  Chest pain.  Cough. EXAM: PORTABLE CHEST 1 VIEW COMPARISON:  August 28, 2018 FINDINGS: The heart size and mediastinal contours are within normal limits. Both lungs are clear. The visualized skeletal structures are unremarkable. IMPRESSION: No active disease. Electronically Signed   By: Dorise Bullion III M.D   On: 01/02/2019 17:16    Procedures Procedures (including critical care time)  Medications Ordered in ED Medications  nicotine (NICODERM CQ - dosed in mg/24 hours) patch 14 mg (has no administration in time range)  aspirin chewable tablet 324 mg (324 mg Oral Given 01/02/19 1642)  ondansetron (ZOFRAN) injection 4 mg (4 mg Intravenous Given 01/02/19 1714)     Initial Impression / Assessment and Plan / ED Course  I have reviewed the triage vital signs and the nursing notes.  Pertinent labs & imaging results that were available during my care of the patient were reviewed by me and considered in my medical decision making (see chart for details).   Patient presented to the emergency room for evaluation of chest pain.  Patient does have a history of chest pain however it is been going on for months now.  Patient had a follow-up with her cardiologist but it sounds like there was some type of disagreement and she has not been there since.  Patient's ED work-up is reassuring.  I explained to her that although she did have the one blockage that they did not treat, that would not be the source of her pain.  I explained to her that it was appropriate for her cardiologist not to place a stent in a vessel with that degree of stenosis.  Patient did seem reassured by that.   I also discussed the findings with the patient's daughter.  Considering her constant pain for last several months I do not feel that her symptoms are related to acute coronary syndrome.  Patient appears stable for outpatient followed up. I did encourage her to see her cardiologist.  Final Clinical Impressions(s) / ED Diagnoses   Final diagnoses:  Chest pain, unspecified type  Hepatic steatosis    ED Discharge Orders         Ordered    ondansetron (ZOFRAN ODT) 8 MG disintegrating tablet  Every 8 hours PRN     01/02/19 1856           Dorie Rank, MD 01/02/19 1901

## 2019-01-02 NOTE — ED Notes (Signed)
Please contact Eileen Stanford, daughter at [phone] for updates.

## 2019-01-02 NOTE — ED Notes (Signed)
Patient verbalizes understanding of discharge instructions. Opportunity for questioning and answers were provided. Armband removed by staff, pt discharged from ED.  

## 2019-01-02 NOTE — ED Triage Notes (Signed)
Pt reports having a stent placed in January, reports she has had constant chest pain and generalized abd pain since December that has not resolved, has followed up with cardiologist since who "did not help me with my chest pain".  Reports black bloody vomit once yesterday, is taking Brilinta.

## 2019-01-02 NOTE — Discharge Instructions (Addendum)
Follow-up with a cardiologist for further evaluation, take the medications as needed for nausea,

## 2019-02-12 ENCOUNTER — Other Ambulatory Visit: Payer: Self-pay | Admitting: Cardiovascular Disease

## 2019-03-06 ENCOUNTER — Other Ambulatory Visit: Payer: Self-pay | Admitting: Cardiovascular Disease

## 2019-09-05 ENCOUNTER — Other Ambulatory Visit: Payer: Self-pay | Admitting: Cardiovascular Disease

## 2019-09-19 ENCOUNTER — Other Ambulatory Visit: Payer: Self-pay | Admitting: Cardiovascular Disease

## 2019-09-26 ENCOUNTER — Other Ambulatory Visit: Payer: Self-pay | Admitting: Cardiovascular Disease

## 2019-10-17 ENCOUNTER — Other Ambulatory Visit: Payer: Self-pay | Admitting: Cardiovascular Disease

## 2019-10-24 ENCOUNTER — Other Ambulatory Visit: Payer: Self-pay | Admitting: Cardiovascular Disease

## 2019-11-07 ENCOUNTER — Other Ambulatory Visit: Payer: Self-pay | Admitting: Cardiovascular Disease

## 2019-11-20 ENCOUNTER — Other Ambulatory Visit: Payer: Self-pay | Admitting: Cardiovascular Disease

## 2019-12-04 ENCOUNTER — Other Ambulatory Visit: Payer: Self-pay | Admitting: Cardiovascular Disease

## 2019-12-11 ENCOUNTER — Other Ambulatory Visit: Payer: Self-pay | Admitting: Cardiovascular Disease

## 2019-12-13 ENCOUNTER — Other Ambulatory Visit: Payer: Self-pay | Admitting: Cardiovascular Disease

## 2019-12-14 NOTE — Telephone Encounter (Signed)
Rx(s) sent to pharmacy electronically.  

## 2019-12-19 ENCOUNTER — Emergency Department (HOSPITAL_COMMUNITY)
Admission: EM | Admit: 2019-12-19 | Discharge: 2019-12-19 | Disposition: A | Discharge status: Home / Self Care | Payer: Medicaid - Out of State | Attending: Emergency Medicine | Admitting: Emergency Medicine

## 2019-12-19 ENCOUNTER — Encounter (HOSPITAL_COMMUNITY): Payer: Self-pay | Admitting: *Deleted

## 2019-12-19 ENCOUNTER — Other Ambulatory Visit: Payer: Self-pay

## 2019-12-19 ENCOUNTER — Emergency Department (HOSPITAL_COMMUNITY): Payer: Medicaid - Out of State

## 2019-12-19 DIAGNOSIS — F17210 Nicotine dependence, cigarettes, uncomplicated: Secondary | ICD-10-CM | POA: Insufficient documentation

## 2019-12-19 DIAGNOSIS — I10 Essential (primary) hypertension: Secondary | ICD-10-CM | POA: Insufficient documentation

## 2019-12-19 DIAGNOSIS — I25119 Atherosclerotic heart disease of native coronary artery with unspecified angina pectoris: Secondary | ICD-10-CM | POA: Insufficient documentation

## 2019-12-19 DIAGNOSIS — Z7982 Long term (current) use of aspirin: Secondary | ICD-10-CM | POA: Diagnosis not present

## 2019-12-19 DIAGNOSIS — R079 Chest pain, unspecified: Secondary | ICD-10-CM | POA: Diagnosis present

## 2019-12-19 DIAGNOSIS — Z79899 Other long term (current) drug therapy: Secondary | ICD-10-CM | POA: Diagnosis not present

## 2019-12-19 DIAGNOSIS — Z9049 Acquired absence of other specified parts of digestive tract: Secondary | ICD-10-CM | POA: Insufficient documentation

## 2019-12-19 HISTORY — DX: Atherosclerotic heart disease of native coronary artery without angina pectoris: I25.10

## 2019-12-19 LAB — TROPONIN I (HIGH SENSITIVITY): Troponin I (High Sensitivity): 3 ng/L (ref <18)

## 2019-12-19 LAB — BASIC METABOLIC PANEL
Anion gap: 8 (ref 5–15)
BUN: 11 mg/dL (ref 6–20)
CO2: 22 mmol/L (ref 22–32)
Calcium: 8.9 mg/dL (ref 8.9–10.3)
Chloride: 103 mmol/L (ref 98–111)
Creatinine, Ser: 0.63 mg/dL (ref 0.44–1.00)
GFR calc Af Amer: >60 mL/min (ref >60)
GFR calc non Af Amer: >60 mL/min (ref >60)
Glucose, Bld: 101 mg/dL — ABNORMAL HIGH (ref 70–99)
Potassium: 3.2 mmol/L — ABNORMAL LOW (ref 3.5–5.1)
Sodium: 133 mmol/L — ABNORMAL LOW (ref 135–145)

## 2019-12-19 LAB — CBC
HCT: 41.4 % (ref 36.0–46.0)
Hemoglobin: 13.3 g/dL (ref 12.0–15.0)
MCH: 33.4 pg (ref 26.0–34.0)
MCHC: 32.1 g/dL (ref 30.0–36.0)
MCV: 104 fL — ABNORMAL HIGH (ref 80.0–100.0)
Platelets: 380 10*3/uL (ref 150–400)
RBC: 3.98 MIL/uL (ref 3.87–5.11)
RDW: 12.8 % (ref 11.5–15.5)
WBC: 10.6 10*3/uL — ABNORMAL HIGH (ref 4.0–10.5)
nRBC: 0 % (ref 0.0–0.2)

## 2019-12-19 MED ORDER — POTASSIUM CHLORIDE 20 MEQ PO PACK
40.0000 meq | PACK | Freq: Once | ORAL | Status: AC
  Administered 2019-12-19: 22:00:00 40 meq via ORAL
  Filled 2019-12-19: qty 2

## 2019-12-19 NOTE — Discharge Instructions (Signed)
Please continue medicines prescribed to you and follow up with your heart doctor Return for worsening symptoms

## 2019-12-19 NOTE — ED Provider Notes (Signed)
[facility] EMERGENCY DEPARTMENT Provider Note   CSN: [id number] Arrival date & time: 12/19/19  1959     History Chief Complaint  Patient presents with  . Chest Pain    Kariel Skillman is a 59 y.o. female who presents with chest pain. She states it started Friday night when she went to go to bed. It is in the center and left side and feels like a stabbing pain. It will last 2-3 minutes and then resolve. The pain will radiate to her bilateral arms. There are no aggravating factors she can identify. Nothing makes it better. She has not tried any nitro because she is not prescribed it. She takes a baby ASA daily. She reports associated diaphoresis and SOB with the pain but no lightheadedness, syncope, N/V. She denies fever but reports some cough and congestion. She has been having a lot of stress because her husband has health problems. She took a nap today and woke up still having pain so came to the ED. Her cardiologist is in [place], [place]. She had 2 stents placed last year to her LAD and RCA. She denies any chest pain currently.  HPI     Past Medical History:  Diagnosis Date  . Coronary artery disease   . Depression   . Herniated disc, cervical   . Hypertension   . Lumbar herniated disc   . NASH (nonalcoholic steatohepatitis)   . Spondylolysis     Patient Active Problem List   Diagnosis Date Noted  . Dyslipidemia, goal LDL below 70 09/15/2018  . CAD S/P PCI 08/31/2018  . Unstable angina ([place])   . Chest pain 08/28/2018  . NASH (nonalcoholic steatohepatitis) 08/28/2018  . Essential hypertension 08/28/2018  . Depression 08/28/2018  . Tobacco use 08/28/2018    Past Surgical History:  Procedure Laterality Date  . CHOLECYSTECTOMY    . CORONARY STENT INTERVENTION N/A 08/31/2018   Procedure: CORONARY STENT INTERVENTION;  Surgeon: Burnell Blanks, MD;  Location: [place] [facility];  Service: Cardiovascular;  Laterality: N/A;  . INTRAVASCULAR PRESSURE WIRE/FFR STUDY N/A  08/31/2018   Procedure: INTRAVASCULAR PRESSURE WIRE/FFR STUDY;  Surgeon: Burnell Blanks, MD;  Location: [place] [facility];  Service: Cardiovascular;  Laterality: N/A;  . INTRAVASCULAR ULTRASOUND/IVUS N/A 08/31/2018   Procedure: Intravascular Ultrasound/IVUS;  Surgeon: Burnell Blanks, MD;  Location: [place] [facility];  Service: Cardiovascular;  Laterality: N/A;  . LEFT HEART CATH AND CORONARY ANGIOGRAPHY N/A 08/31/2018   Procedure: LEFT HEART CATH AND CORONARY ANGIOGRAPHY;  Surgeon: Burnell Blanks, MD;  Location: [place] [facility];  Service: Cardiovascular;  Laterality: N/A;     OB History   No obstetric history on file.     Family History  Problem Relation Age of Onset  . Angina Father 19       Lived to 65  . Aortic stenosis Father   . Aortic stenosis Sister   . Heart disease Sister        has stents, dx late 33s, early 26s  . Stroke Mother        Died at 66  . Aortic stenosis Brother     Social History   Tobacco Use  . Smoking status: Current Every Day Smoker    Packs/day: 0.50    Years: 45.00    Pack years: 22.50    Types: Cigarettes  . Smokeless tobacco: Never Used  Substance Use Topics  . Alcohol use: Yes    Comment: occasionally   . Drug  use: Never    Home Medications Prior to Admission medications   Medication Sig Start Date End Date Taking? Authorizing Provider  amLODipine (NORVASC) 5 MG tablet TAKE 1 TABLET BY MOUTH ONCE DAILY IN THE MORNING - NEED TO SCHEDULE APPOINTMENT 12/14/19   Lorretta Harp, MD  aspirin 81 MG tablet Take 81 mg by mouth daily.    [provider]  atorvastatin (LIPITOR) 80 MG tablet TAKE 1 TABLET BY MOUTH ONCE DAILY AT 6PM. APPOINTMENT REQUIRED FOR FUTURE REFILLS 12/14/19   Lorretta Harp, MD  BRILINTA 90 MG TABS tablet TAKE 1 TABLET BY MOUTH TWICE DAILY . APPOINTMENT REQUIRED FOR FUTURE REFILLS 12/14/19   Lorretta Harp, MD  buPROPion [facility] SR) 150 MG 12 hr tablet Take 150 mg by mouth at  bedtime.    [provider]  carvedilol (COREG) 3.125 MG tablet TAKE 1 TABLET BY MOUTH TWICE DAILY WITH A MEAL. APPOINTMENT REQUIRED FOR REFILLS. 12/14/19   Lorretta Harp, MD  cyclobenzaprine (FLEXERIL) 10 MG tablet Take 10 mg by mouth 3 (three) times daily as needed for muscle spasms.    [provider]  isosorbide mononitrate (IMDUR) 30 MG 24 hr tablet Take 1 tablet by mouth once daily 03/08/19   Lorretta Harp, MD  losartan (COZAAR) 100 MG tablet TAKE 1 TABLET BY MOUTH IN THE MORNING 09/08/19   Lorretta Harp, MD  nicotine (NICODERM CQ - DOSED IN MG/24 HOURS) 21 mg/24hr patch Place 1 patch (21 mg total) onto the skin daily. 09/02/18   Amin, Jeanella Flattery, MD  ondansetron (ZOFRAN ODT) 8 MG disintegrating tablet Take 1 tablet (8 mg total) by mouth every 8 (eight) hours as needed for nausea or vomiting. 01/02/19   Dorie Rank, MD  PARoxetine (PAXIL) 40 MG tablet Take 40 mg by mouth every morning.    [provider]    Allergies    Otho Darner allergy]  Review of Systems   Review of Systems  Constitutional: Negative for chills and fever.  Respiratory: Positive for cough and shortness of breath. Negative for wheezing.   Cardiovascular: Positive for chest pain. Negative for palpitations and leg swelling.  Gastrointestinal: Negative for abdominal pain, nausea and vomiting.  Neurological: Negative for syncope and light-headedness.  All other systems reviewed and are negative.   Physical Exam Updated Vital Signs BP 102/82   Pulse 83   Temp 97.7 F (36.5 C) (Oral)   Resp (!) 23   Ht 5' 2"  (1.575 m)   Wt 58.5 kg   SpO2 98%   BMI 23.59 kg/m   Physical Exam Vitals and nursing note reviewed.  Constitutional:      General: She is not in acute distress.    Appearance: She is well-developed. She is not ill-appearing.     Comments: Calm and cooperative NAD.  HENT:     Head: Normocephalic and atraumatic.  Eyes:     General: No scleral icterus.        Right eye: No discharge.        Left eye: No discharge.     Conjunctiva/sclera: Conjunctivae normal.     Pupils: Pupils are equal, round, and reactive to light.  Cardiovascular:     Rate and Rhythm: Normal rate.  Pulmonary:     Effort: Pulmonary effort is normal. No respiratory distress.  Abdominal:     General: There is no distension.  Musculoskeletal:     Cervical back: Normal range of motion.  Skin:  General: Skin is warm and dry.  Neurological:     Mental Status: She is alert and oriented to person, place, and time.  Psychiatric:        Behavior: Behavior normal.     ED Results / Procedures / Treatments   Labs (all labs ordered are listed, but only abnormal results are displayed) Labs Reviewed  BASIC METABOLIC PANEL - Abnormal; Notable for the following components:      Result Value   Sodium 133 (*)    Potassium 3.2 (*)    Glucose, Bld 101 (*)    All other components within normal limits  CBC - Abnormal; Notable for the following components:   WBC 10.6 (*)    MCV 104.0 (*)    All other components within normal limits  TROPONIN I (HIGH SENSITIVITY)    EKG EKG Interpretation  Date/Time:  Sunday Dec 19 2019 20:05:36 EDT Ventricular Rate:  86 PR Interval:  132 QRS Duration: 92 QT Interval:  376 QTC Calculation: 449 R Axis:   63 Text Interpretation: Normal sinus rhythm Normal ECG No significant change since last tracing Confirmed by Calvert Cantor [phone]) on 12/19/2019 8:16:09 PM   Radiology DG Chest 2 View  Result Date: 12/19/2019 CLINICAL DATA:  Chest pain. Left-sided pain since Friday. EXAM: CHEST - 2 VIEW COMPARISON:  Radiograph 01/02/2019 FINDINGS: The cardiomediastinal contours are normal. Incidental azygos fissure. Pulmonary vasculature is normal. No consolidation, pleural effusion, or pneumothorax. No acute osseous abnormalities are seen. IMPRESSION: No acute chest findings. Electronically Signed   By: Keith Rake M.D.   On: 12/19/2019 21:10     Procedures Procedures (including critical care time)  Medications Ordered in ED Medications  potassium chloride (KLOR-CON) packet 40 mEq (40 mEq Oral Given 12/19/19 2204)    ED Course  I have reviewed the triage vital signs and the nursing notes.  Pertinent labs & imaging results that were available during my care of the patient were reviewed by me and considered in my medical decision making (see chart for details).  59 year old female presents with chest pain which has been intermittent for the past 2 days.  Pain is somewhat atypical sounding although she is stating that it feels like when she has had to have stents placed in the past.  Her vital signs are normal here.  Heart is regular rate and rhythm.  Lungs are clear to auscultation. Abdomen is soft and non-tender. There is no lower extremity edema. EKG was obtained and is NSR. Will obtain CXR, labs  CBC shows mild leukocytosis and high MVC. BMP shows mild hypokalemia (3.2). Dose of potassium was given. CXR is negative. Trop is 3. Do not feel we need a repeat since pt has had pain on and off for days. She will call her cardiologist in the AM.   MDM Rules/Calculators/A&P                       Final Clinical Impression(s) / ED Diagnoses Final diagnoses:  Nonspecific chest pain    Rx / DC Orders ED Discharge Orders    None       Recardo Evangelist, PA-C 12/19/19 2253    Truddie Hidden, MD 12/20/19 1053

## 2019-12-19 NOTE — ED Triage Notes (Signed)
Pt with left sided cp since Friday evening.  Pt with hx of two stents.  Pt states sob with cp but denies N/V

## 2020-02-02 ENCOUNTER — Other Ambulatory Visit: Payer: Self-pay

## 2020-02-02 ENCOUNTER — Emergency Department (HOSPITAL_COMMUNITY)
Admission: EM | Admit: 2020-02-02 | Discharge: 2020-02-02 | Disposition: A | Discharge status: Home / Self Care | Payer: PRIVATE HEALTH INSURANCE | Attending: Emergency Medicine | Admitting: Emergency Medicine

## 2020-02-02 ENCOUNTER — Emergency Department (HOSPITAL_COMMUNITY): Payer: PRIVATE HEALTH INSURANCE

## 2020-02-02 ENCOUNTER — Encounter (HOSPITAL_COMMUNITY): Payer: Self-pay | Admitting: *Deleted

## 2020-02-02 DIAGNOSIS — R1030 Lower abdominal pain, unspecified: Secondary | ICD-10-CM | POA: Insufficient documentation

## 2020-02-02 DIAGNOSIS — E871 Hypo-osmolality and hyponatremia: Secondary | ICD-10-CM | POA: Diagnosis not present

## 2020-02-02 DIAGNOSIS — R1032 Left lower quadrant pain: Secondary | ICD-10-CM | POA: Insufficient documentation

## 2020-02-02 DIAGNOSIS — I10 Essential (primary) hypertension: Secondary | ICD-10-CM | POA: Diagnosis not present

## 2020-02-02 DIAGNOSIS — Z79899 Other long term (current) drug therapy: Secondary | ICD-10-CM | POA: Diagnosis not present

## 2020-02-02 DIAGNOSIS — R1031 Right lower quadrant pain: Secondary | ICD-10-CM | POA: Insufficient documentation

## 2020-02-02 DIAGNOSIS — I2510 Atherosclerotic heart disease of native coronary artery without angina pectoris: Secondary | ICD-10-CM | POA: Diagnosis not present

## 2020-02-02 DIAGNOSIS — F17210 Nicotine dependence, cigarettes, uncomplicated: Secondary | ICD-10-CM | POA: Insufficient documentation

## 2020-02-02 DIAGNOSIS — Z91013 Allergy to seafood: Secondary | ICD-10-CM | POA: Diagnosis not present

## 2020-02-02 DIAGNOSIS — R0602 Shortness of breath: Secondary | ICD-10-CM | POA: Diagnosis present

## 2020-02-02 DIAGNOSIS — K7581 Nonalcoholic steatohepatitis (NASH): Secondary | ICD-10-CM

## 2020-02-02 LAB — URINALYSIS, ROUTINE W REFLEX MICROSCOPIC
Bilirubin Urine: NEGATIVE
Glucose, UA: NEGATIVE mg/dL
Ketones, ur: NEGATIVE mg/dL
Leukocytes,Ua: NEGATIVE
Nitrite: NEGATIVE
Protein, ur: NEGATIVE mg/dL
Specific Gravity, Urine: 1.014 (ref 1.005–1.030)
pH: 6 (ref 5.0–8.0)

## 2020-02-02 LAB — COMPREHENSIVE METABOLIC PANEL
ALT: 67 U/L — ABNORMAL HIGH (ref 0–44)
AST: 112 U/L — ABNORMAL HIGH (ref 15–41)
Albumin: 3.8 g/dL (ref 3.5–5.0)
Alkaline Phosphatase: 100 U/L (ref 38–126)
Anion gap: 13 (ref 5–15)
BUN: 15 mg/dL (ref 6–20)
CO2: 18 mmol/L — ABNORMAL LOW (ref 22–32)
Calcium: 8.4 mg/dL — ABNORMAL LOW (ref 8.9–10.3)
Chloride: 98 mmol/L (ref 98–111)
Creatinine, Ser: 0.78 mg/dL (ref 0.44–1.00)
GFR calc Af Amer: >60 mL/min (ref >60)
GFR calc non Af Amer: >60 mL/min (ref >60)
Glucose, Bld: 106 mg/dL — ABNORMAL HIGH (ref 70–99)
Potassium: 3.3 mmol/L — ABNORMAL LOW (ref 3.5–5.1)
Sodium: 129 mmol/L — ABNORMAL LOW (ref 135–145)
Total Bilirubin: 0.4 mg/dL (ref 0.3–1.2)
Total Protein: 7.1 g/dL (ref 6.5–8.1)

## 2020-02-02 LAB — CBC WITH DIFFERENTIAL/PLATELET
Abs Immature Granulocytes: 0.04 10*3/uL (ref 0.00–0.07)
Basophils Absolute: 0.1 10*3/uL (ref 0.0–0.1)
Basophils Relative: 1 %
Eosinophils Absolute: 0.1 10*3/uL (ref 0.0–0.5)
Eosinophils Relative: 1 %
HCT: 39 % (ref 36.0–46.0)
Hemoglobin: 13.1 g/dL (ref 12.0–15.0)
Immature Granulocytes: 0 %
Lymphocytes Relative: 39 %
Lymphs Abs: 4.3 10*3/uL — ABNORMAL HIGH (ref 0.7–4.0)
MCH: 33.6 pg (ref 26.0–34.0)
MCHC: 33.6 g/dL (ref 30.0–36.0)
MCV: 100 fL (ref 80.0–100.0)
Monocytes Absolute: 0.7 10*3/uL (ref 0.1–1.0)
Monocytes Relative: 6 %
Neutro Abs: 5.8 10*3/uL (ref 1.7–7.7)
Neutrophils Relative %: 53 %
Platelets: 395 10*3/uL (ref 150–400)
RBC: 3.9 MIL/uL (ref 3.87–5.11)
RDW: 13.1 % (ref 11.5–15.5)
WBC: 11.1 10*3/uL — ABNORMAL HIGH (ref 4.0–10.5)
nRBC: 0 % (ref 0.0–0.2)

## 2020-02-02 LAB — LIPASE, BLOOD: Lipase: 35 U/L (ref 11–51)

## 2020-02-02 LAB — TROPONIN I (HIGH SENSITIVITY)
Troponin I (High Sensitivity): 3 ng/L (ref <18)
Troponin I (High Sensitivity): 3 ng/L (ref <18)

## 2020-02-02 MED ORDER — IOHEXOL 300 MG/ML  SOLN
100.0000 mL | Freq: Once | INTRAMUSCULAR | Status: AC | PRN
  Administered 2020-02-02: 100 mL via INTRAVENOUS

## 2020-02-02 MED ORDER — ONDANSETRON HCL 4 MG/2ML IJ SOLN
4.0000 mg | Freq: Once | INTRAMUSCULAR | Status: AC
  Administered 2020-02-02: 4 mg via INTRAVENOUS
  Filled 2020-02-02: qty 2

## 2020-02-02 MED ORDER — MORPHINE SULFATE (PF) 4 MG/ML IV SOLN
4.0000 mg | Freq: Once | INTRAVENOUS | Status: AC
  Administered 2020-02-02: 4 mg via INTRAVENOUS
  Filled 2020-02-02: qty 1

## 2020-02-02 MED ORDER — ONDANSETRON 4 MG PO TBDP
4.0000 mg | ORAL_TABLET | Freq: Three times a day (TID) | ORAL | 0 refills | Status: AC | PRN
Start: 2020-02-02 — End: ?

## 2020-02-02 MED ORDER — OXYCODONE HCL 5 MG PO TABS: 5.0000 mg | ORAL_TABLET | Freq: Four times a day (QID) | ORAL | 0 refills | Status: AC | PRN

## 2020-02-02 NOTE — ED Notes (Signed)
Pt. With CT. 

## 2020-02-02 NOTE — ED Provider Notes (Signed)
[facility] Provider Note   CSN: [id number] Arrival date & time: 02/02/20  1722     History Chief Complaint  Patient presents with  . Shortness of Breath    Leslie Rasmussen is a 59 y.o. female.  Pt presents to the ED today with lower abdominal pain for the past 4 days.  She said it's gotten worse and is now causing sob.  Pt denies any f/c.  She has nausea.  No vomiting now, but did vomit a few days ago.  She has diarrhea all the time, but it does not seem worse than usual.  Pt has a hx of CAD and has some epigastric pain.          Past Medical History:  Diagnosis Date  . Coronary artery disease   . Depression   . Herniated disc, cervical   . Hypertension   . Lumbar herniated disc   . NASH (nonalcoholic steatohepatitis)   . Spondylolysis     Patient Active Problem List   Diagnosis Date Noted  . Dyslipidemia, goal LDL below 70 09/15/2018  . CAD S/P PCI 08/31/2018  . Unstable angina ([place])   . Chest pain 08/28/2018  . NASH (nonalcoholic steatohepatitis) 08/28/2018  . Essential hypertension 08/28/2018  . Depression 08/28/2018  . Tobacco use 08/28/2018    Past Surgical History:  Procedure Laterality Date  . CHOLECYSTECTOMY    . CORONARY STENT INTERVENTION N/A 08/31/2018   Procedure: CORONARY STENT INTERVENTION;  Surgeon: Burnell Blanks, MD;  Location: [facility];  Service: Cardiovascular;  Laterality: N/A;  . INTRAVASCULAR PRESSURE WIRE/FFR STUDY N/A 08/31/2018   Procedure: INTRAVASCULAR PRESSURE WIRE/FFR STUDY;  Surgeon: Burnell Blanks, MD;  Location: [facility];  Service: Cardiovascular;  Laterality: N/A;  . INTRAVASCULAR ULTRASOUND/IVUS N/A 08/31/2018   Procedure: Intravascular Ultrasound/IVUS;  Surgeon: Burnell Blanks, MD;  Location: [facility];  Service: Cardiovascular;  Laterality: N/A;  . LEFT HEART CATH AND CORONARY ANGIOGRAPHY N/A 08/31/2018   Procedure: LEFT HEART CATH AND CORONARY ANGIOGRAPHY;   Surgeon: Burnell Blanks, MD;  Location: [facility];  Service: Cardiovascular;  Laterality: N/A;     OB History   No obstetric history on file.     Family History  Problem Relation Age of Onset  . Angina Father 59       Lived to 65  . Aortic stenosis Father   . Aortic stenosis Sister   . Heart disease Sister        has stents, dx late 20s, early 77s  . Stroke Mother        Died at 23  . Aortic stenosis Brother     Social History   Tobacco Use  . Smoking status: Current Every Day Smoker    Packs/day: 0.50    Years: 45.00    Pack years: 22.50    Types: Cigarettes  . Smokeless tobacco: Never Used  Substance Use Topics  . Alcohol use: Yes    Comment: occasionally   . Drug use: Never    Home Medications Prior to Admission medications   Medication Sig Start Date End Date Taking? Authorizing Provider  amLODipine (NORVASC) 5 MG tablet TAKE 1 TABLET BY MOUTH ONCE DAILY IN THE MORNING - NEED TO SCHEDULE APPOINTMENT Patient taking differently: Take 5 mg by mouth in the morning.  12/14/19  Yes Lorretta Harp, MD  aspirin 81 MG tablet Take 81 mg by mouth in the morning.  Yes [provider]  atorvastatin (LIPITOR) 80 MG tablet TAKE 1 TABLET BY MOUTH ONCE DAILY AT 6PM. APPOINTMENT REQUIRED FOR FUTURE REFILLS Patient taking differently: Take 80 mg by mouth at bedtime.  12/14/19  Yes Lorretta Harp, MD  baclofen (LIORESAL) 10 MG tablet Take 10 mg by mouth at bedtime.   Yes [provider]  BRILINTA 90 MG TABS tablet TAKE 1 TABLET BY MOUTH TWICE DAILY . APPOINTMENT REQUIRED FOR FUTURE REFILLS Patient taking differently: Take 90 mg by mouth 2 (two) times daily.  12/14/19  Yes Lorretta Harp, MD  busPIRone (BUSPAR) 7.5 MG tablet Take 7.5 mg by mouth 2 (two) times daily.   Yes [provider]  carvedilol (COREG) 3.125 MG tablet TAKE 1 TABLET BY MOUTH TWICE DAILY WITH A MEAL. APPOINTMENT REQUIRED FOR REFILLS. Patient taking differently:  Take 3.125 mg by mouth 2 (two) times daily with a meal.  12/14/19  Yes Lorretta Harp, MD  DULoxetine (CYMBALTA) 60 MG capsule Take 60 mg by mouth in the morning.   Yes [provider]  isosorbide mononitrate (IMDUR) 30 MG 24 hr tablet Take 1 tablet by mouth once daily Patient taking differently: Take 30 mg by mouth in the morning.  03/08/19  Yes Lorretta Harp, MD  losartan (COZAAR) 100 MG tablet TAKE 1 TABLET BY MOUTH IN THE MORNING Patient taking differently: Take 100 mg by mouth in the morning.  09/08/19  Yes Lorretta Harp, MD  vitamin E (VITAMIN E) 180 MG (400 UNITS) capsule Take 400 Units by mouth in the morning and at bedtime.   Yes [provider]  ondansetron (ZOFRAN ODT) 4 MG disintegrating tablet Take 1 tablet (4 mg total) by mouth every 8 (eight) hours as needed for nausea or vomiting. 02/02/20   Isla Pence, MD  oxyCODONE (ROXICODONE) 5 MG immediate release tablet Take 1 tablet (5 mg total) by mouth every 6 (six) hours as needed for severe pain. 02/02/20   Isla Pence, MD    Allergies    Otho Darner allergy]  Review of Systems   Review of Systems  Respiratory: Positive for shortness of breath.   Gastrointestinal: Positive for abdominal pain.  All other systems reviewed and are negative.   Physical Exam Updated Vital Signs BP 118/70   Pulse 85   Temp 97.9 F (36.6 C) (Oral)   Resp (!) 22   Ht 5' 2"  (1.575 m)   Wt 56.7 kg   SpO2 94%   BMI 22.86 kg/m   Physical Exam Vitals and nursing note reviewed.  Constitutional:      Appearance: She is well-developed.  HENT:     Head: Normocephalic and atraumatic.     Mouth/Throat:     Mouth: Mucous membranes are moist.     Pharynx: Oropharynx is clear.  Eyes:     Extraocular Movements: Extraocular movements intact.     Pupils: Pupils are equal, round, and reactive to light.  Cardiovascular:     Rate and Rhythm: Normal rate and regular rhythm.  Pulmonary:     Effort: Pulmonary effort is  normal.     Breath sounds: Normal breath sounds.  Abdominal:     General: Bowel sounds are normal.     Palpations: Abdomen is soft.     Tenderness: There is abdominal tenderness in the right lower quadrant and left lower quadrant.  Musculoskeletal:        General: Normal range of motion.     Cervical back: Normal  range of motion and neck supple.  Skin:    General: Skin is warm.     Capillary Refill: Capillary refill takes less than 2 seconds.  Neurological:     General: No focal deficit present.     Mental Status: She is alert and oriented to person, place, and time.  Psychiatric:        Mood and Affect: Mood normal.        Behavior: Behavior normal.     ED Results / Procedures / Treatments   Labs (all labs ordered are listed, but only abnormal results are displayed) Labs Reviewed  CBC WITH DIFFERENTIAL/PLATELET - Abnormal; Notable for the following components:      Result Value   WBC 11.1 (*)    Lymphs Abs 4.3 (*)    All other components within normal limits  COMPREHENSIVE METABOLIC PANEL - Abnormal; Notable for the following components:   Sodium 129 (*)    Potassium 3.3 (*)    CO2 18 (*)    Glucose, Bld 106 (*)    Calcium 8.4 (*)    AST 112 (*)    ALT 67 (*)    All other components within normal limits  URINALYSIS, ROUTINE W REFLEX MICROSCOPIC - Abnormal; Notable for the following components:   Color, Urine COLORLESS (*)    Hgb urine dipstick SMALL (*)    Bacteria, UA RARE (*)    All other components within normal limits  LIPASE, BLOOD  TROPONIN I (HIGH SENSITIVITY)  TROPONIN I (HIGH SENSITIVITY)    EKG EKG Interpretation  Date/Time:  Wednesday February 02 2020 18:33:16 EDT Ventricular Rate:  91 PR Interval:  124 QRS Duration: 94 QT Interval:  386 QTC Calculation: 474 R Axis:   52 Text Interpretation: Normal sinus rhythm Normal ECG No significant change since last tracing Confirmed by Isla Pence [phone]) on 02/02/2020 7:12:40 PM   Radiology DG Chest 2  View  Result Date: 02/02/2020 CLINICAL DATA:  Shortness of breath EXAM: CHEST - 2 VIEW COMPARISON:  12/19/2019 FINDINGS: The heart size and mediastinal contours are within normal limits. Both lungs are clear. The visualized skeletal structures are unremarkable. IMPRESSION: No active cardiopulmonary disease. Electronically Signed   By: Donavan Foil M.D.   On: 02/02/2020 19:15   CT ABDOMEN PELVIS W CONTRAST  Result Date: 02/02/2020 CLINICAL DATA:  Progressive right lower quadrant abdominal pain, short of breath, nausea EXAM: CT ABDOMEN AND PELVIS WITH CONTRAST TECHNIQUE: Multidetector CT imaging of the abdomen and pelvis was performed using the standard protocol following bolus administration of intravenous contrast. CONTRAST:  144m OMNIPAQUE IOHEXOL 300 MG/ML  SOLN COMPARISON:  None. FINDINGS: Lower chest: No acute pleural or parenchymal lung disease. Hepatobiliary: No focal liver abnormality is seen. Status post cholecystectomy. No biliary dilatation. Pancreas: Unremarkable. No pancreatic ductal dilatation or surrounding inflammatory changes. Spleen: Normal in size without focal abnormality. Adrenals/Urinary Tract: Peripelvic cyst upper pole left kidney. Otherwise the kidneys enhance normally and symmetrically. No urinary tract calculi or obstructive uropathy. Bladder is moderately distended without filling defect or irregularity. The adrenals are normal. Stomach/Bowel: No bowel obstruction or ileus. Normal appendix right lower quadrant. No bowel wall thickening or inflammatory change. Vascular/Lymphatic: Aortic atherosclerosis. No enlarged abdominal or pelvic lymph nodes. Reproductive: Uterus and bilateral adnexa are unremarkable. Other: No abdominal wall hernia or abnormality. No abdominopelvic ascites. Musculoskeletal: No acute or destructive bony lesions. There is asymmetric sclerosis of the left sacroiliac joint compatible with sacroiliitis. Reconstructed images demonstrate no additional findings.  IMPRESSION: 1.  No acute intra-abdominal or intrapelvic process. Normal appendix. 2. Left-sided sacroiliitis. 3. Aortic Atherosclerosis (ICD10-I70.0). Electronically Signed   By: Randa Ngo M.D.   On: 02/02/2020 21:03    Procedures Procedures (including critical care time)  Medications Ordered in ED Medications  morphine 4 MG/ML injection 4 mg (has no administration in time range)  morphine 4 MG/ML injection 4 mg (4 mg Intravenous Given 02/02/20 2014)  ondansetron (ZOFRAN) injection 4 mg (4 mg Intravenous Given 02/02/20 2010)  iohexol (OMNIPAQUE) 300 MG/ML solution 100 mL (100 mLs Intravenous Contrast Given 02/02/20 2048)    ED Course  I have reviewed the triage vital signs and the nursing notes.  Pertinent labs & imaging results that were available during my care of the patient were reviewed by me and considered in my medical decision making (see chart for details).    MDM Rules/Calculators/A&P                          Work up does not reveal any acute cause for pt's pain.  Sodium is slightly lower than normal.  She is not on diuretics.  I will have pt f/u with pcp and repeat labs.  No evidence of lung or cardiac abn.  Pt will be d/c home with instructions to return if worse.  Final Clinical Impression(s) / ED Diagnoses Final diagnoses:  Lower abdominal pain  Hyponatremia    Rx / DC Orders ED Discharge Orders         Ordered    oxyCODONE (ROXICODONE) 5 MG immediate release tablet  Every 6 hours PRN     Discontinue  Reprint     02/02/20 2259    ondansetron (ZOFRAN ODT) 4 MG disintegrating tablet  Every 8 hours PRN     Discontinue  Reprint     02/02/20 2259           Isla Pence, MD 02/02/20 2259

## 2020-02-02 NOTE — Discharge Instructions (Addendum)
Repeat your sodium level in about 1 week.

## 2020-02-02 NOTE — ED Triage Notes (Signed)
Pt started x 4 days ago with abdominal pain that has gotten progressively worse and has now caused sob

## 2020-03-28 ENCOUNTER — Other Ambulatory Visit: Payer: Self-pay | Admitting: Cardiovascular Disease

## 2020-04-06 ENCOUNTER — Emergency Department (HOSPITAL_COMMUNITY): Payer: Medicaid - Out of State

## 2020-04-06 ENCOUNTER — Other Ambulatory Visit: Payer: Self-pay

## 2020-04-06 ENCOUNTER — Emergency Department (HOSPITAL_COMMUNITY)
Admission: EM | Admit: 2020-04-06 | Discharge: 2020-04-06 | Disposition: A | Discharge status: Home / Self Care | Payer: Medicaid - Out of State | Attending: Emergency Medicine | Admitting: Emergency Medicine

## 2020-04-06 ENCOUNTER — Encounter (HOSPITAL_COMMUNITY): Payer: Self-pay | Admitting: Emergency Medicine

## 2020-04-06 DIAGNOSIS — I259 Chronic ischemic heart disease, unspecified: Secondary | ICD-10-CM

## 2020-04-06 DIAGNOSIS — R079 Chest pain, unspecified: Secondary | ICD-10-CM | POA: Diagnosis not present

## 2020-04-06 DIAGNOSIS — F17210 Nicotine dependence, cigarettes, uncomplicated: Secondary | ICD-10-CM | POA: Insufficient documentation

## 2020-04-06 DIAGNOSIS — I2510 Atherosclerotic heart disease of native coronary artery without angina pectoris: Secondary | ICD-10-CM | POA: Insufficient documentation

## 2020-04-06 DIAGNOSIS — I10 Essential (primary) hypertension: Secondary | ICD-10-CM | POA: Diagnosis not present

## 2020-04-06 DIAGNOSIS — Z79899 Other long term (current) drug therapy: Secondary | ICD-10-CM | POA: Diagnosis not present

## 2020-04-06 LAB — CBC
HCT: 41.2 % (ref 36.0–46.0)
Hemoglobin: 13.7 g/dL (ref 12.0–15.0)
MCH: 34.3 pg — ABNORMAL HIGH (ref 26.0–34.0)
MCHC: 33.3 g/dL (ref 30.0–36.0)
MCV: 103 fL — ABNORMAL HIGH (ref 80.0–100.0)
Platelets: 322 10*3/uL (ref 150–400)
RBC: 4 MIL/uL (ref 3.87–5.11)
RDW: 14.4 % (ref 11.5–15.5)
WBC: 11 10*3/uL — ABNORMAL HIGH (ref 4.0–10.5)
nRBC: 0 % (ref 0.0–0.2)

## 2020-04-06 LAB — TROPONIN I (HIGH SENSITIVITY)
Troponin I (High Sensitivity): 7 ng/L (ref <18)
Troponin I (High Sensitivity): 6 ng/L (ref <18)

## 2020-04-06 LAB — BASIC METABOLIC PANEL
Anion gap: 11 (ref 5–15)
BUN: 13 mg/dL (ref 6–20)
CO2: 23 mmol/L (ref 22–32)
Calcium: 9 mg/dL (ref 8.9–10.3)
Chloride: 102 mmol/L (ref 98–111)
Creatinine, Ser: 0.61 mg/dL (ref 0.44–1.00)
GFR calc Af Amer: >60 mL/min (ref >60)
GFR calc non Af Amer: >60 mL/min (ref >60)
Glucose, Bld: 102 mg/dL — ABNORMAL HIGH (ref 70–99)
Potassium: 4.6 mmol/L (ref 3.5–5.1)
Sodium: 136 mmol/L (ref 135–145)

## 2020-04-06 MED ORDER — OXYCODONE HCL 5 MG PO TABS
5.0000 mg | ORAL_TABLET | Freq: Once | ORAL | Status: AC
  Administered 2020-04-06: 5 mg via ORAL
  Filled 2020-04-06: qty 1

## 2020-04-06 MED ORDER — ALUM & MAG HYDROXIDE-SIMETH 200-200-20 MG/5ML PO SUSP
30.0000 mL | Freq: Once | ORAL | Status: AC
  Administered 2020-04-06: 30 mL via ORAL
  Filled 2020-04-06: qty 30

## 2020-04-06 NOTE — ED Notes (Signed)
Pt ready to leave. Pt stated she would get discharge instructions via mychart. Charge nurse witness to pt statement.

## 2020-04-06 NOTE — ED Triage Notes (Signed)
Pt states she has had chest pain  and hypertension for the past week. Patient is on medication for BP but PCP dcd amlodipine in July.

## 2020-04-06 NOTE — ED Provider Notes (Signed)
[facility] EMERGENCY DEPARTMENT Provider Note   CSN: [id number] Arrival date & time: 04/06/20  1640     History Chief Complaint  Patient presents with  . Chest Pain    Corey Caulfield is a 59 y.o. female.  HPI  HPI: A 59 year old patient with a history of hypertension and hypercholesterolemia presents for evaluation of chest pain. Initial onset of pain was more than 6 hours ago. The patient's chest pain is sharp and is not worse with exertion. The patient complains of nausea. The patient's chest pain is not middle- or left-sided, is not well-localized, is not described as heaviness/pressure/tightness and does not radiate to the arms/jaw/neck. The patient denies diaphoresis. The patient has smoked in the past 90 days. The patient has no history of stroke, has no history of peripheral artery disease, denies any history of treated diabetes, has no relevant family history of coronary artery disease (first degree relative at less than age 16) and does not have an elevated BMI (>=30).   Past Medical History:  Diagnosis Date  . Coronary artery disease   . Depression   . Herniated disc, cervical   . Hypertension   . Lumbar herniated disc   . NASH (nonalcoholic steatohepatitis)   . Spondylolysis     Patient Active Problem List   Diagnosis Date Noted  . Dyslipidemia, goal LDL below 70 09/15/2018  . CAD S/P PCI 08/31/2018  . Unstable angina ([place])   . Chest pain 08/28/2018  . NASH (nonalcoholic steatohepatitis) 08/28/2018  . Essential hypertension 08/28/2018  . Depression 08/28/2018  . Tobacco use 08/28/2018    Past Surgical History:  Procedure Laterality Date  . CHOLECYSTECTOMY    . CORONARY STENT INTERVENTION N/A 08/31/2018   Procedure: CORONARY STENT INTERVENTION;  Surgeon: Burnell Blanks, MD;  Location: [facility];  Service: Cardiovascular;  Laterality: N/A;  . INTRAVASCULAR PRESSURE WIRE/FFR STUDY N/A 08/31/2018   Procedure: INTRAVASCULAR PRESSURE WIRE/FFR STUDY;   Surgeon: Burnell Blanks, MD;  Location: [facility];  Service: Cardiovascular;  Laterality: N/A;  . INTRAVASCULAR ULTRASOUND/IVUS N/A 08/31/2018   Procedure: Intravascular Ultrasound/IVUS;  Surgeon: Burnell Blanks, MD;  Location: [facility];  Service: Cardiovascular;  Laterality: N/A;  . LEFT HEART CATH AND CORONARY ANGIOGRAPHY N/A 08/31/2018   Procedure: LEFT HEART CATH AND CORONARY ANGIOGRAPHY;  Surgeon: Burnell Blanks, MD;  Location: [facility];  Service: Cardiovascular;  Laterality: N/A;     OB History   No obstetric history on file.     Family History  Problem Relation Age of Onset  . Angina Father 21       Lived to 15  . Aortic stenosis Father   . Aortic stenosis Sister   . Heart disease Sister        has stents, dx late 34s, early 72s  . Stroke Mother        Died at 40  . Aortic stenosis Brother     Social History   Tobacco Use  . Smoking status: Current Every Day Smoker    Packs/day: 1.00    Years: 45.00    Pack years: 45.00    Types: Cigarettes  . Smokeless tobacco: Never Used  Vaping Use  . Vaping Use: Never used  Substance Use Topics  . Alcohol use: Yes    Comment: occasionally   . Drug use: Never    Home Medications Prior to Admission medications   Medication Sig Start Date End Date Taking? Authorizing  Provider  amLODipine (NORVASC) 5 MG tablet TAKE 1 TABLET BY MOUTH ONCE DAILY IN THE MORNING - NEED TO SCHEDULE APPOINTMENT Patient taking differently: Take 5 mg by mouth in the morning.  12/14/19   Lorretta Harp, MD  aspirin 81 MG tablet Take 81 mg by mouth in the morning.     [provider]  atorvastatin (LIPITOR) 80 MG tablet TAKE 1 TABLET BY MOUTH ONCE DAILY AT 6PM. APPOINTMENT REQUIRED FOR FUTURE REFILLS Patient taking differently: Take 80 mg by mouth at bedtime.  12/14/19   Lorretta Harp, MD  baclofen (LIORESAL) 10 MG tablet Take 10 mg by mouth at bedtime.    [provider]    BRILINTA 90 MG TABS tablet TAKE 1 TABLET BY MOUTH TWICE DAILY . APPOINTMENT REQUIRED FOR FUTURE REFILLS Patient taking differently: Take 90 mg by mouth 2 (two) times daily.  12/14/19   Lorretta Harp, MD  busPIRone (BUSPAR) 7.5 MG tablet Take 7.5 mg by mouth 2 (two) times daily.    [provider]  carvedilol (COREG) 3.125 MG tablet TAKE 1 TABLET BY MOUTH TWICE DAILY WITH A MEAL. APPOINTMENT REQUIRED FOR REFILLS. Patient taking differently: Take 3.125 mg by mouth 2 (two) times daily with a meal.  12/14/19   Lorretta Harp, MD  DULoxetine (CYMBALTA) 60 MG capsule Take 60 mg by mouth in the morning.    [provider]  isosorbide mononitrate (IMDUR) 30 MG 24 hr tablet Take 1 tablet by mouth once daily 03/28/20   Lorretta Harp, MD  losartan (COZAAR) 100 MG tablet TAKE 1 TABLET BY MOUTH IN THE MORNING Patient taking differently: Take 100 mg by mouth in the morning.  09/08/19   Lorretta Harp, MD  ondansetron (ZOFRAN ODT) 4 MG disintegrating tablet Take 1 tablet (4 mg total) by mouth every 8 (eight) hours as needed for nausea or vomiting. 02/02/20   Isla Pence, MD  oxyCODONE (ROXICODONE) 5 MG immediate release tablet Take 1 tablet (5 mg total) by mouth every 6 (six) hours as needed for severe pain. 02/02/20   Isla Pence, MD  vitamin E (VITAMIN E) 180 MG (400 UNITS) capsule Take 400 Units by mouth in the morning and at bedtime.    [provider]    Allergies    Nitrofuran derivatives and Crab [shellfish allergy]  Review of Systems   Review of Systems  All other systems reviewed and are negative.   Physical Exam Updated Vital Signs BP 122/72   Pulse 85   Temp 98.2 F (36.8 C) (Oral)   Resp 16   Ht 1.575 m (5' 2" )   Wt 55.8 kg   SpO2 97%   BMI 22.50 kg/m   Physical Exam Vitals and nursing note reviewed.  Constitutional:      General: She is not in acute distress.    Appearance: She is well-developed.  HENT:     Head: Normocephalic and  atraumatic.     Right Ear: External ear normal.     Left Ear: External ear normal.  Eyes:     General: No scleral icterus.       Right eye: No discharge.        Left eye: No discharge.     Conjunctiva/sclera: Conjunctivae normal.  Neck:     Trachea: No tracheal deviation.  Cardiovascular:     Rate and Rhythm: Normal rate and regular rhythm.  Pulmonary:     Effort: Pulmonary effort is normal. No respiratory distress.  Breath sounds: Normal breath sounds. No stridor. No wheezing or rales.  Abdominal:     General: Bowel sounds are normal. There is no distension.     Palpations: Abdomen is soft.     Tenderness: There is no abdominal tenderness. There is no guarding or rebound.  Musculoskeletal:        General: No tenderness.     Cervical back: Neck supple.  Skin:    General: Skin is warm and dry.     Findings: No rash.  Neurological:     Mental Status: She is alert.     Cranial Nerves: No cranial nerve deficit (no facial droop, extraocular movements intact, no slurred speech).     Sensory: No sensory deficit.     Motor: No abnormal muscle tone or seizure activity.     Coordination: Coordination normal.     ED Results / Procedures / Treatments   Labs (all labs ordered are listed, but only abnormal results are displayed) Labs Reviewed  BASIC METABOLIC PANEL - Abnormal; Notable for the following components:      Result Value   Glucose, Bld 102 (*)    All other components within normal limits  CBC - Abnormal; Notable for the following components:   WBC 11.0 (*)    MCV 103.0 (*)    MCH 34.3 (*)    All other components within normal limits  TROPONIN I (HIGH SENSITIVITY)  TROPONIN I (HIGH SENSITIVITY)    EKG EKG Interpretation  Date/Time:  Thursday April 06 2020 17:06:38 EDT Ventricular Rate:  92 PR Interval:  196 QRS Duration: 92 QT Interval:  372 QTC Calculation: 460 R Axis:   47 Text Interpretation: Sinus rhythm with Premature ventricular complexes or Fusion  complexes Otherwise normal ECG No significant change since last tracing Confirmed by Dorie Rank [phone]) on 04/06/2020 5:26:03 PM   Radiology DG Chest 2 View  Result Date: 04/06/2020 CLINICAL DATA:  Chest pain. EXAM: CHEST - 2 VIEW COMPARISON:  February 02, 2020 FINDINGS: There is no evidence of acute infiltrate, pleural effusion or pneumothorax. The heart size and mediastinal contours are within normal limits. The visualized skeletal structures are unremarkable. IMPRESSION: No active cardiopulmonary disease. Electronically Signed   By: Virgina Norfolk M.D.   On: 04/06/2020 18:14    Procedures Procedures (including critical care time)  Medications Ordered in ED Medications  alum & mag hydroxide-simeth (MAALOX/MYLANTA) 200-200-20 MG/5ML suspension 30 mL (30 mLs Oral Given 04/06/20 2049)  oxyCODONE (Oxy IR/ROXICODONE) immediate release tablet 5 mg (5 mg Oral Given 04/06/20 2049)    ED Course  I have reviewed the triage vital signs and the nursing notes.  Pertinent labs & imaging results that were available during my care of the patient were reviewed by me and considered in my medical decision making (see chart for details).    MDM Rules/Calculators/A&P HEAR Score: 4                        Pt presented to the ED for evaluation of chest pain.  Hx of ACS.  Moderate risk heart score.  Sx ongoing for over a week.  Sx atypical.  ED workup reassuring.  Labs normal.  CXR negative.  Doubt PE, ACS.  Safe for outpt follow up with cardiologist. Final Clinical Impression(s) / ED Diagnoses Final diagnoses:  Nonspecific chest pain    Rx / DC Orders ED Discharge Orders    None       Dorie Rank,  MD 04/07/20 1503

## 2020-04-21 ENCOUNTER — Emergency Department (HOSPITAL_COMMUNITY): Payer: Medicaid - Out of State

## 2020-04-21 ENCOUNTER — Other Ambulatory Visit: Payer: Self-pay

## 2020-04-21 ENCOUNTER — Encounter (HOSPITAL_COMMUNITY): Payer: Self-pay

## 2020-04-21 ENCOUNTER — Emergency Department (HOSPITAL_COMMUNITY)
Admission: EM | Admit: 2020-04-21 | Discharge: 2020-04-21 | Disposition: A | Discharge status: Home / Self Care | Payer: Medicaid - Out of State | Attending: Emergency Medicine | Admitting: Emergency Medicine

## 2020-04-21 DIAGNOSIS — I10 Essential (primary) hypertension: Secondary | ICD-10-CM | POA: Insufficient documentation

## 2020-04-21 DIAGNOSIS — S7011XA Contusion of right thigh, initial encounter: Secondary | ICD-10-CM

## 2020-04-21 DIAGNOSIS — Y929 Unspecified place or not applicable: Secondary | ICD-10-CM | POA: Insufficient documentation

## 2020-04-21 DIAGNOSIS — S301XXA Contusion of abdominal wall, initial encounter: Secondary | ICD-10-CM | POA: Insufficient documentation

## 2020-04-21 DIAGNOSIS — S300XXA Contusion of lower back and pelvis, initial encounter: Secondary | ICD-10-CM | POA: Insufficient documentation

## 2020-04-21 DIAGNOSIS — Y999 Unspecified external cause status: Secondary | ICD-10-CM | POA: Insufficient documentation

## 2020-04-21 DIAGNOSIS — I2510 Atherosclerotic heart disease of native coronary artery without angina pectoris: Secondary | ICD-10-CM | POA: Diagnosis not present

## 2020-04-21 DIAGNOSIS — Y939 Activity, unspecified: Secondary | ICD-10-CM | POA: Diagnosis not present

## 2020-04-21 DIAGNOSIS — W1839XA Other fall on same level, initial encounter: Secondary | ICD-10-CM | POA: Diagnosis not present

## 2020-04-21 DIAGNOSIS — W19XXXA Unspecified fall, initial encounter: Secondary | ICD-10-CM

## 2020-04-21 DIAGNOSIS — M25551 Pain in right hip: Secondary | ICD-10-CM

## 2020-04-21 DIAGNOSIS — F17210 Nicotine dependence, cigarettes, uncomplicated: Secondary | ICD-10-CM | POA: Diagnosis not present

## 2020-04-21 DIAGNOSIS — S79921A Unspecified injury of right thigh, initial encounter: Secondary | ICD-10-CM | POA: Diagnosis present

## 2020-04-21 LAB — CBC WITH DIFFERENTIAL/PLATELET
Abs Immature Granulocytes: 0.1 10*3/uL — ABNORMAL HIGH (ref 0.00–0.07)
Basophils Absolute: 0.1 10*3/uL (ref 0.0–0.1)
Basophils Relative: 1 %
Eosinophils Absolute: 0.2 10*3/uL (ref 0.0–0.5)
Eosinophils Relative: 2 %
HCT: 35.9 % — ABNORMAL LOW (ref 36.0–46.0)
Hemoglobin: 11.7 g/dL — ABNORMAL LOW (ref 12.0–15.0)
Immature Granulocytes: 1 %
Lymphocytes Relative: 43 %
Lymphs Abs: 3.7 10*3/uL (ref 0.7–4.0)
MCH: 34.5 pg — ABNORMAL HIGH (ref 26.0–34.0)
MCHC: 32.6 g/dL (ref 30.0–36.0)
MCV: 105.9 fL — ABNORMAL HIGH (ref 80.0–100.0)
Monocytes Absolute: 0.5 10*3/uL (ref 0.1–1.0)
Monocytes Relative: 6 %
Neutro Abs: 4 10*3/uL (ref 1.7–7.7)
Neutrophils Relative %: 47 %
Platelets: 438 10*3/uL — ABNORMAL HIGH (ref 150–400)
RBC: 3.39 MIL/uL — ABNORMAL LOW (ref 3.87–5.11)
RDW: 14.7 % (ref 11.5–15.5)
WBC: 8.6 10*3/uL (ref 4.0–10.5)
nRBC: 0 % (ref 0.0–0.2)

## 2020-04-21 LAB — BASIC METABOLIC PANEL
Anion gap: 11 (ref 5–15)
BUN: 12 mg/dL (ref 6–20)
CO2: 21 mmol/L — ABNORMAL LOW (ref 22–32)
Calcium: 8.8 mg/dL — ABNORMAL LOW (ref 8.9–10.3)
Chloride: 102 mmol/L (ref 98–111)
Creatinine, Ser: 0.57 mg/dL (ref 0.44–1.00)
GFR calc Af Amer: >60 mL/min (ref >60)
GFR calc non Af Amer: >60 mL/min (ref >60)
Glucose, Bld: 104 mg/dL — ABNORMAL HIGH (ref 70–99)
Potassium: 4 mmol/L (ref 3.5–5.1)
Sodium: 134 mmol/L — ABNORMAL LOW (ref 135–145)

## 2020-04-21 MED ORDER — IOHEXOL 300 MG/ML  SOLN
100.0000 mL | Freq: Once | INTRAMUSCULAR | Status: AC | PRN
  Administered 2020-04-21: 100 mL via INTRAVENOUS

## 2020-04-21 MED ORDER — NICOTINE 21 MG/24HR TD PT24
21.0000 mg | MEDICATED_PATCH | Freq: Once | TRANSDERMAL | Status: DC
  Administered 2020-04-21: 21 mg via TRANSDERMAL
  Filled 2020-04-21: qty 1

## 2020-04-21 MED ORDER — OXYCODONE HCL 5 MG PO TABS
5.0000 mg | ORAL_TABLET | Freq: Four times a day (QID) | ORAL | 0 refills | Status: AC | PRN
Start: 2020-04-21 — End: ?

## 2020-04-21 NOTE — ED Triage Notes (Signed)
Pt to er, pt states that she was catching her husband who was falling and he fell on top of her, pt states that she has some serious bruises, states that she went to urgent care and was told to come to the er to have a ct scan because she is on blood thinners.

## 2020-04-21 NOTE — ED Provider Notes (Signed)
[facility] EMERGENCY DEPARTMENT Provider Note   CSN: [id number] Arrival date & time: 04/21/20  1353     History Chief Complaint  Patient presents with  . Fall    Leslie Rasmussen is a 59 y.o. female with PMHx CAD s/p stent (on brillinta), HTN, depression, NASH who presents to the ED today with complaint of fall that occurred 2 days ago. Pt reports that she was helping catch her husband who weighs approximately 190 pounds; he fell directly on top of her and since then pt has had pain/bruising to various places including her right flank, RLQ, and R posterior thigh. She went to [facility] yesterday and was advised to come to the ED for CT scan to rule out internal bleeding. Pt did not want to go to [facility] ED and so she waited until today. She denies any head injury during the fall. No blood in urine. No other complaints at this time.   The history is provided by the patient and medical records.       Past Medical History:  Diagnosis Date  . Coronary artery disease   . Depression   . Herniated disc, cervical   . Hypertension   . Lumbar herniated disc   . NASH (nonalcoholic steatohepatitis)   . Spondylolysis     Patient Active Problem List   Diagnosis Date Noted  . Dyslipidemia, goal LDL below 70 09/15/2018  . CAD S/P PCI 08/31/2018  . Unstable angina ([place])   . Chest pain 08/28/2018  . NASH (nonalcoholic steatohepatitis) 08/28/2018  . Essential hypertension 08/28/2018  . Depression 08/28/2018  . Tobacco use 08/28/2018    Past Surgical History:  Procedure Laterality Date  . CHOLECYSTECTOMY    . CORONARY STENT INTERVENTION N/A 08/31/2018   Procedure: CORONARY STENT INTERVENTION;  Surgeon: Burnell Blanks, MD;  Location: [place] CV LAB;  Service: Cardiovascular;  Laterality: N/A;  . INTRAVASCULAR PRESSURE WIRE/FFR STUDY N/A 08/31/2018   Procedure: INTRAVASCULAR PRESSURE WIRE/FFR STUDY;  Surgeon: Burnell Blanks, MD;  Location: [place] CV LAB;  Service:  Cardiovascular;  Laterality: N/A;  . INTRAVASCULAR ULTRASOUND/IVUS N/A 08/31/2018   Procedure: Intravascular Ultrasound/IVUS;  Surgeon: Burnell Blanks, MD;  Location: [place] CV LAB;  Service: Cardiovascular;  Laterality: N/A;  . LEFT HEART CATH AND CORONARY ANGIOGRAPHY N/A 08/31/2018   Procedure: LEFT HEART CATH AND CORONARY ANGIOGRAPHY;  Surgeon: Burnell Blanks, MD;  Location: [place] CV LAB;  Service: Cardiovascular;  Laterality: N/A;     OB History   No obstetric history on file.     Family History  Problem Relation Age of Onset  . Angina Father 77       Lived to 38  . Aortic stenosis Father   . Aortic stenosis Sister   . Heart disease Sister        has stents, dx late 61s, early 54s  . Stroke Mother        Died at 73  . Aortic stenosis Brother     Social History   Tobacco Use  . Smoking status: Current Every Day Smoker    Packs/day: 1.00    Years: 45.00    Pack years: 45.00    Types: Cigarettes  . Smokeless tobacco: Never Used  Vaping Use  . Vaping Use: Never used  Substance Use Topics  . Alcohol use: Yes    Comment: occasionally   . Drug use: Never    Home Medications Prior to Admission medications   Medication Sig  Start Date End Date Taking? Authorizing Provider  amLODipine (NORVASC) 5 MG tablet TAKE 1 TABLET BY MOUTH ONCE DAILY IN THE MORNING - NEED TO SCHEDULE APPOINTMENT Patient taking differently: Take 5 mg by mouth in the morning.  12/14/19   Lorretta Harp, MD  aspirin 81 MG tablet Take 81 mg by mouth in the morning.     [provider]  atorvastatin (LIPITOR) 80 MG tablet TAKE 1 TABLET BY MOUTH ONCE DAILY AT 6PM. APPOINTMENT REQUIRED FOR FUTURE REFILLS Patient taking differently: Take 80 mg by mouth at bedtime.  12/14/19   Lorretta Harp, MD  baclofen (LIORESAL) 10 MG tablet Take 10 mg by mouth at bedtime.    [provider]  BRILINTA 90 MG TABS tablet TAKE 1 TABLET BY MOUTH TWICE DAILY . APPOINTMENT  REQUIRED FOR FUTURE REFILLS Patient taking differently: Take 90 mg by mouth 2 (two) times daily.  12/14/19   Lorretta Harp, MD  busPIRone (BUSPAR) 7.5 MG tablet Take 7.5 mg by mouth 2 (two) times daily.    [provider]  carvedilol (COREG) 3.125 MG tablet TAKE 1 TABLET BY MOUTH TWICE DAILY WITH A MEAL. APPOINTMENT REQUIRED FOR REFILLS. Patient taking differently: Take 3.125 mg by mouth 2 (two) times daily with a meal.  12/14/19   Lorretta Harp, MD  DULoxetine (CYMBALTA) 60 MG capsule Take 60 mg by mouth in the morning.    [provider]  isosorbide mononitrate (IMDUR) 30 MG 24 hr tablet Take 1 tablet by mouth once daily 03/28/20   Lorretta Harp, MD  losartan (COZAAR) 100 MG tablet TAKE 1 TABLET BY MOUTH IN THE MORNING Patient taking differently: Take 100 mg by mouth in the morning.  09/08/19   Lorretta Harp, MD  ondansetron (ZOFRAN ODT) 4 MG disintegrating tablet Take 1 tablet (4 mg total) by mouth every 8 (eight) hours as needed for nausea or vomiting. 02/02/20   Isla Pence, MD  oxyCODONE (ROXICODONE) 5 MG immediate release tablet Take 1 tablet (5 mg total) by mouth every 6 (six) hours as needed for severe pain. 02/02/20   Isla Pence, MD  oxyCODONE (ROXICODONE) 5 MG immediate release tablet Take 1 tablet (5 mg total) by mouth every 6 (six) hours as needed for severe pain. 04/21/20   Alroy Bailiff, Ashritha Desrosiers, PA-C  vitamin E (VITAMIN E) 180 MG (400 UNITS) capsule Take 400 Units by mouth in the morning and at bedtime.    [provider]    Allergies    Nitrofuran derivatives and Crab [shellfish allergy]  Review of Systems   Review of Systems  Musculoskeletal: Positive for arthralgias.  Skin: Positive for color change.  Neurological: Negative for syncope and headaches.  All other systems reviewed and are negative.   Physical Exam Updated Vital Signs BP 118/74 (BP Location: Right Arm)   Pulse 94   Temp 97.6 F (36.4 C) (Oral)   Resp 18   Ht 5' 2"   (1.575 m)   Wt 54.4 kg   SpO2 99%   BMI 21.95 kg/m   Physical Exam Vitals and nursing note reviewed.  Constitutional:      Appearance: She is not ill-appearing.  HENT:     Head: Normocephalic and atraumatic.     Comments: No raccoon's sign or battle's sign. Negative hemotympanum bilaterally.     Right Ear: Tympanic membrane normal.     Left Ear: Tympanic membrane normal.  Eyes:     Conjunctiva/sclera: Conjunctivae normal.  Cardiovascular:  Rate and Rhythm: Normal rate and regular rhythm.     Pulses: Normal pulses.  Pulmonary:     Effort: Pulmonary effort is normal.     Breath sounds: Normal breath sounds. No wheezing, rhonchi or rales.  Abdominal:     Palpations: Abdomen is soft.     Tenderness: There is abdominal tenderness. There is no guarding or rebound.       Comments: Soft, Large circular area of ecchymosis to RLQ with surrounding TTP, +BS throughout, no r/g/r, neg murphy's, neg mcburney's   Musculoskeletal:     Cervical back: Neck supple.       Back:       Legs:     Comments: No C, T, or L midline spinal TTP. Large ecchymotic area to right flank with TTP. No surrounding musculature TTP. ROM intact to neck and back.   Swelling and ecchymosis noted to posterior R thigh with TTP along this area as well as the posterior pelvis. ROM intact to RLE. Sensation intact throughout. 2+ DP pulses.   Skin:    General: Skin is warm and dry.  Neurological:     Mental Status: She is alert.     ED Results / Procedures / Treatments   Labs (all labs ordered are listed, but only abnormal results are displayed) Labs Reviewed  BASIC METABOLIC PANEL - Abnormal; Notable for the following components:      Result Value   Sodium 134 (*)    CO2 21 (*)    Glucose, Bld 104 (*)    Calcium 8.8 (*)    All other components within normal limits  CBC WITH DIFFERENTIAL/PLATELET - Abnormal; Notable for the following components:   RBC 3.39 (*)    Hemoglobin 11.7 (*)    HCT 35.9 (*)     MCV 105.9 (*)    MCH 34.5 (*)    Platelets 438 (*)    Abs Immature Granulocytes 0.10 (*)    All other components within normal limits  URINALYSIS, ROUTINE W REFLEX MICROSCOPIC    EKG None  Radiology DG Pelvis 1-2 Views  Result Date: 04/21/2020 CLINICAL DATA:  Fall: Patient's husband landed on top EXAM: PELVIS - 1-2 VIEW COMPARISON:  None. FINDINGS: Bones of the pelvis appear intact and congruent. Proximal femora are intact and normally located. Some enthesopathic changes noted at the right lesser trochanter. Transitional lumbosacral vertebrae is present with a partially sacralized right transverse process. Degenerative changes in the lower lumbar spine, hips and pelvis are mild. Mild lateral right hip soft tissue thickening is noted, possibly contusive change/swelling. Atherosclerotic calcifications are noted in the soft tissues. IMPRESSION: 1. No acute fracture or other acute osseous abnormality. 2. Mild lateral right hip soft tissue thickening, possibly contusive change/swelling. Electronically Signed   By: Lovena Le M.D.   On: 04/21/2020 17:27   CT Abdomen Pelvis W Contrast  Result Date: 04/21/2020 CLINICAL DATA:  Abdominal trauma. Abdominal and right flank ecchymosis. Husband fell on patient. EXAM: CT ABDOMEN AND PELVIS WITH CONTRAST TECHNIQUE: Multidetector CT imaging of the abdomen and pelvis was performed using the standard protocol following bolus administration of intravenous contrast. CONTRAST:  163m OMNIPAQUE IOHEXOL 300 MG/ML  SOLN COMPARISON:  CT 02/02/2020 FINDINGS: Lower chest: No basilar pneumothorax. No pleural fluid. No focal basilar opacity. Hepatobiliary: No hepatic injury or perihepatic hematoma. Postcholecystectomy. Mild hepatic steatosis. Pancreas: No evidence of injury. No ductal dilatation or inflammation. Spleen: No splenic injury or perisplenic hematoma. Adrenals/Urinary Tract: No adrenal hemorrhage or renal injury identified.  Early excretion of IV contrast in the renal  collecting systems. Left renal cyst. Bladder is unremarkable. Stomach/Bowel: There is submucosal fatty infiltration involving the cecum, ascending, and transverse colon. There is air interspersed between the colonic haustra. No evidence of colonic or bowel injury. Normal appendix. No bowel wall thickening or inflammation. No mesenteric hematoma. Vascular/Lymphatic: No evidence of vascular injury. Abdominal aortic atherosclerosis without aneurysm. Portal vein is patent. No abdominopelvic adenopathy. Reproductive: Uterus and bilateral adnexa are unremarkable. Other: Skin thickening and patchy subcutaneous edema involving the right lower anterior abdominal wall. Patchy hematoma involving the right flank abdominal wall. Soft tissue edema involving the lateral right hip. No evidence of active extravasation. No free fluid or free air. Fat containing umbilical hernia. Musculoskeletal: No acute fracture of the lumbar spine, pelvis, or included ribs. Stable sclerosis of the left sacroiliac joint. IMPRESSION: 1. Subcutaneous contusions involving the right flank, hip, and anterior abdominal wall. No evidence of active extravasation. 2. No additional acute traumatic injury to the abdomen or pelvis. 3. Submucosal fatty infiltration involving the cecum, ascending, and transverse colon, can be seen with chronic inflammation. Aortic Atherosclerosis (ICD10-I70.0). Electronically Signed   By: Keith Rake M.D.   On: 04/21/2020 18:51   DG Femur Min 2 Views Right  Result Date: 04/21/2020 CLINICAL DATA:  Hip pain, fall, patient's husband landed on top, significant bruising to the right hip and thigh, recent cardiac catheterization as well. EXAM: RIGHT FEMUR 2 VIEWS COMPARISON:  None. FINDINGS: The femur appears grossly intact. Corticated ossification adjacent the lesser trochanter is likely enthesopathic in nature. No acute or suspicious osseous abnormalities. Normal bone mineralization. Inclusion portions of the bony right  hemipelvis as well as the patella, proximal tibia and fibula are unremarkable. Femoral head is normally located. Mild tricompartmental degenerative changes at the knee without evidence of gross traumatic malalignment on these nondedicated radiographs. Some mild medial soft tissue thickening along the adductor compartment as well as a more ill-defined possibly contusive changes of the lateral hip. No soft tissue gas or foreign body. Few vascular calcifications are present in the soft tissues. IMPRESSION: 1. No acute or suspicious osseous abnormalities. 2. Some mild medial soft tissue thickening along the adductor compartment and lateral right hip. Could reflect contusion and/or hematoma. 3. Mild tricompartmental degenerative changes at the knee. Electronically Signed   By: Lovena Le M.D.   On: 04/21/2020 17:26    Procedures Procedures (including critical care time)  Medications Ordered in ED Medications  nicotine (NICODERM CQ - dosed in mg/24 hours) patch 21 mg (21 mg Transdermal Patch Applied 04/21/20 1830)  iohexol (OMNIPAQUE) 300 MG/ML solution 100 mL (100 mLs Intravenous Contrast Given 04/21/20 1805)    ED Course  I have reviewed the triage vital signs and the nursing notes.  Pertinent labs & imaging results that were available during my care of the patient were reviewed by me and considered in my medical decision making (see chart for details).    MDM Rules/Calculators/A&P                          59 year old female who presents to the ED today at the request of urgent care for CT scan to rule out internal bleeding.  She was attempting to catch her husband who is approximately 190 pounds 2 days ago when he fell on top of her.  Patient is anticoagulated however she denies head injury.  As she went to urgent care yesterday due to bruising to  her abdomen, right flank, right posterior thigh as well as various other places on her upper extremities.  She was encouraged to come to the ED for CT scan  however patient did not want to [facility] to [facility] ED so she waited another day until she could come here.  She has ecchymotic areas as noted above in physical exam.  There is no signs of head trauma.  TMs normal without hemotympanum.  We will plan for CT abdomen pelvis, x-ray of the right femur and hip.  Patient does have ecchymosis to other areas however no obvious tenderness palpation.  Xray of R hip/pelvis does show concern for either contusion vs hematoma; no fractures CT scan with subcutaneous contusions; no extravasation appreciated. It did pick up on the thigh contusion as well.   Will plan to discharge patient home at this time. Will provide a short course of pain medication to take PRN. Pt to follow up with her PCP. She is in agreement with plan and stable for discharge home.   This note was prepared using Dragon voice recognition software and may include unintentional dictation errors due to the inherent limitations of voice recognition software.  Final Clinical Impression(s) / ED Diagnoses Final diagnoses:  Fall, initial encounter  Contusion of abdominal wall, initial encounter  Contusion of right thigh, initial encounter  Contusion of lower back, initial encounter    Rx / DC Orders ED Discharge Orders         Ordered    oxyCODONE (ROXICODONE) 5 MG immediate release tablet  Every 6 hours PRN        04/21/20 1910           Discharge Instructions     Your Xrays and CT scan showed contusions which is essentially a deep bruise. There was no signs of bone fractures or internal bleeding. I have prescribed a short course of pain medication for you to take as needed. I would recommend applying ice to these areas.   Follow up with your PCP regarding your ED visit today.        Eustaquio Maize, PA-C 04/21/20 1914    Fredia Sorrow, MD 04/22/20 2053

## 2020-04-21 NOTE — Discharge Instructions (Addendum)
Your Xrays and CT scan showed contusions which is essentially a deep bruise. There was no signs of bone fractures or internal bleeding. I have prescribed a short course of pain medication for you to take as needed. I would recommend applying ice to these areas.   Follow up with your PCP regarding your ED visit today.

## 2020-04-25 ENCOUNTER — Other Ambulatory Visit: Payer: Self-pay | Admitting: Cardiovascular Disease

## 2020-05-28 ENCOUNTER — Other Ambulatory Visit: Payer: Self-pay | Admitting: Cardiovascular Disease

## 2020-06-04 ENCOUNTER — Other Ambulatory Visit: Payer: Self-pay | Admitting: Cardiovascular Disease

## 2020-06-11 ENCOUNTER — Other Ambulatory Visit: Payer: Self-pay | Admitting: Cardiovascular Disease

## 2020-06-18 ENCOUNTER — Other Ambulatory Visit: Payer: Self-pay | Admitting: Cardiovascular Disease

## 2020-12-29 IMAGING — CT CT ABD-PELV W/ CM
2 of 5 series · 15 of 46 positions shown, 17 images · IV contrast (omnipaque)
Comparison: None.

CLINICAL DATA: Progressive right lower quadrant abdominal pain,
short of breath, nausea

EXAM:
CT ABDOMEN AND PELVIS WITH CONTRAST
TECHNIQUE: Multidetector CT imaging of the abdomen and pelvis was performed
using the standard protocol following bolus administration of
intravenous contrast.
CONTRAST:  100mL OMNIPAQUE IOHEXOL 300 MG/ML  SOLN

[Series 2: axial st · axial · 0.68mm/px · z∈[-760,-355]mm · 12 of 93 slices shown, 14 images]
[im 6/93  soft-tissue]
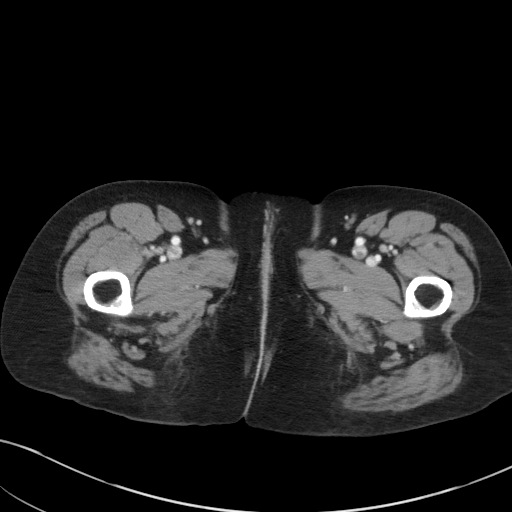
[im 6/93  bone]
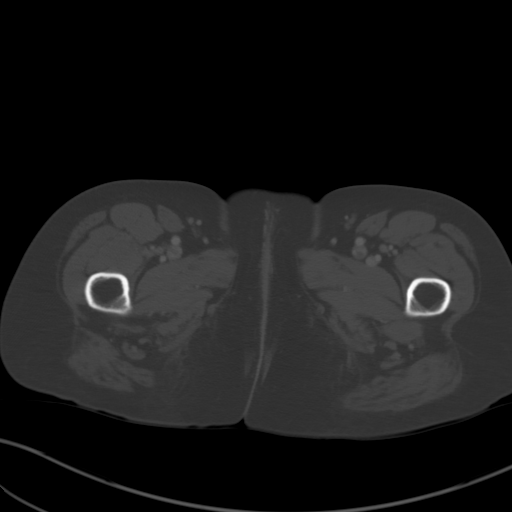
[im 16/93  soft-tissue]
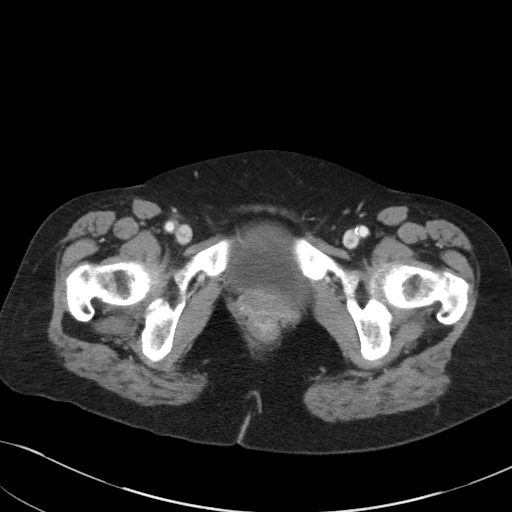
[im 21/93  soft-tissue]
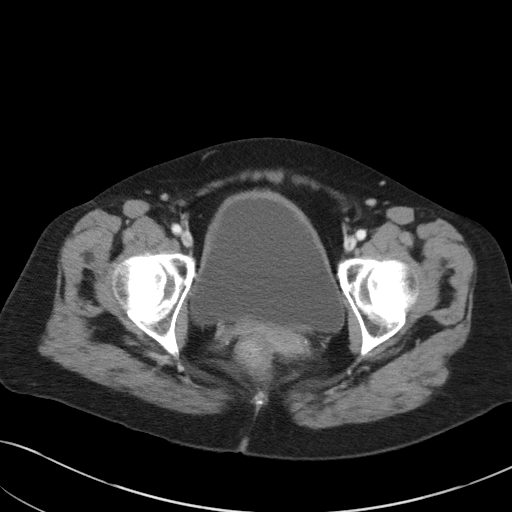
[im 26/93  soft-tissue]
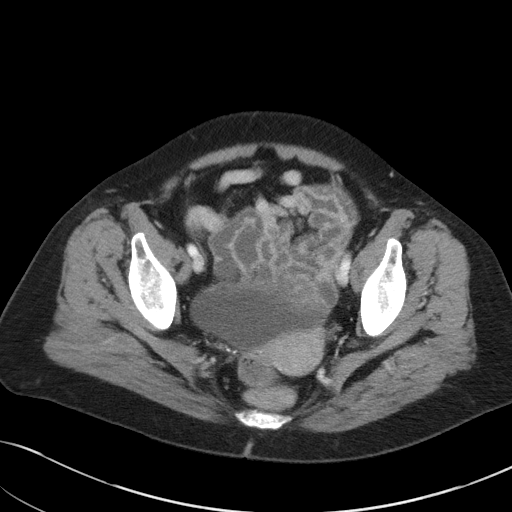
[im 36/93  soft-tissue]
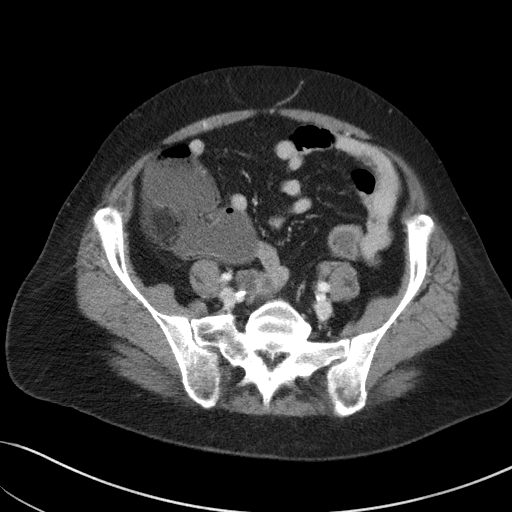
[im 41/93  soft-tissue]
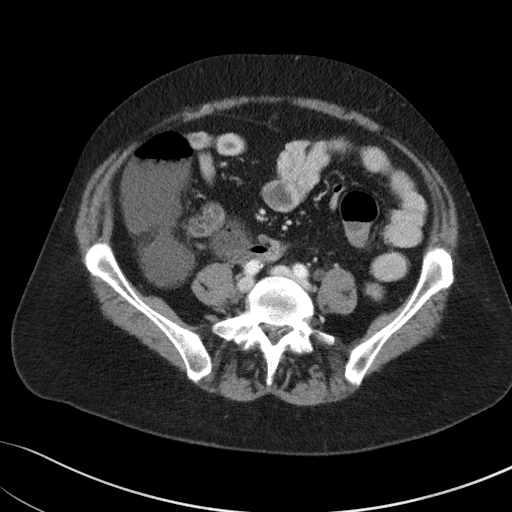
[im 52/93  soft-tissue]
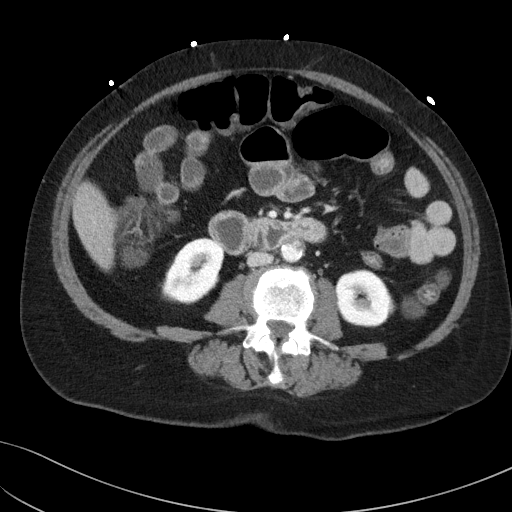
[im 57/93  soft-tissue]
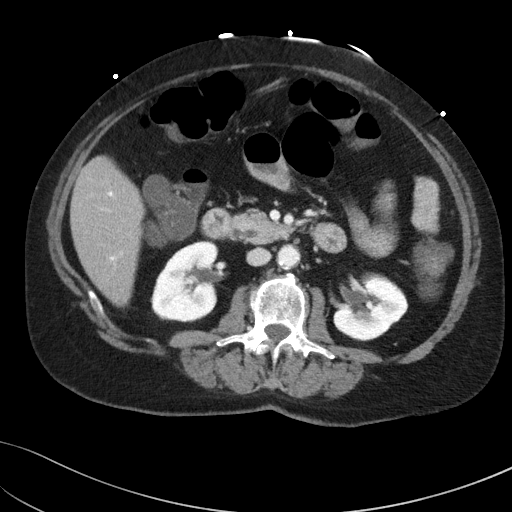
[im 67/93  soft-tissue]
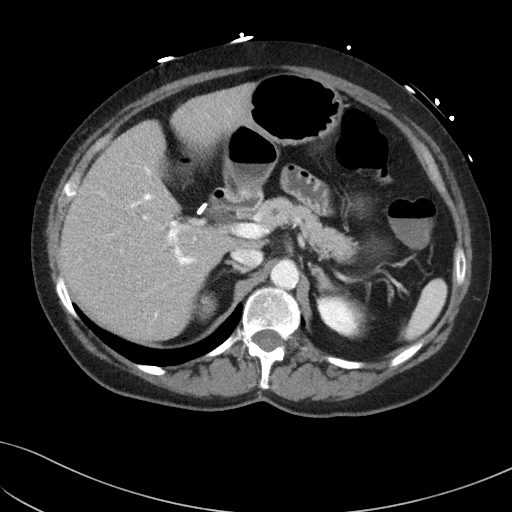
[im 67/93  bone]
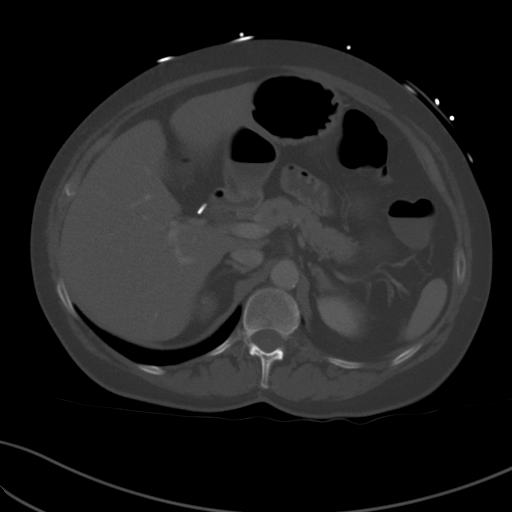
[im 72/93  soft-tissue]
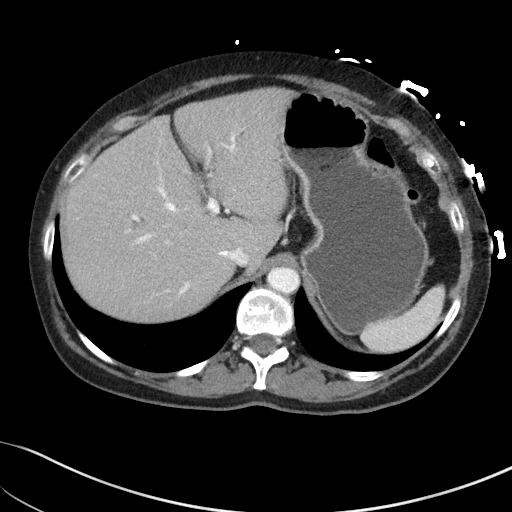
[im 77/93  soft-tissue]
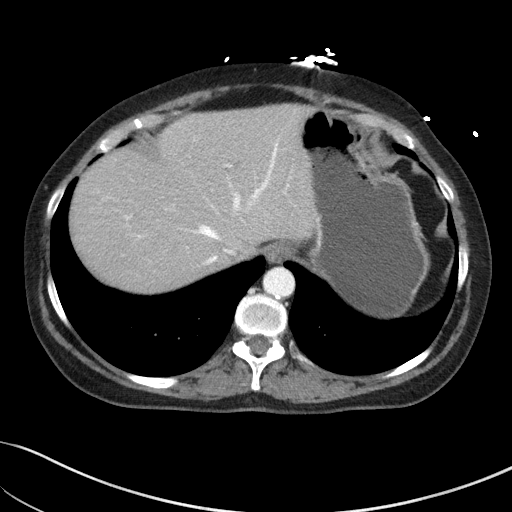
[im 87/93  soft-tissue]
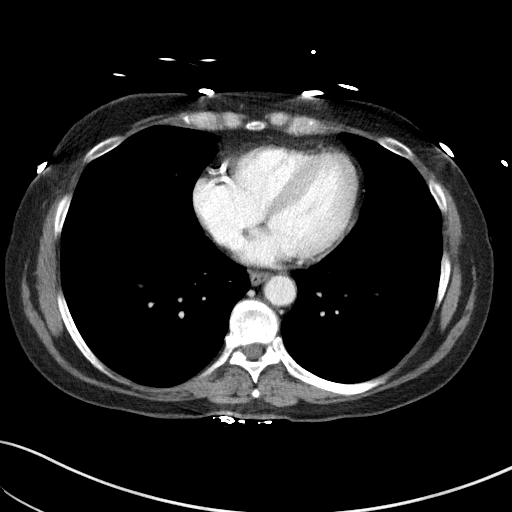

[Series 5: coronal st · coronal · 0.60mm/px · 3 of 87 slices shown]
[im 29/87  soft-tissue]
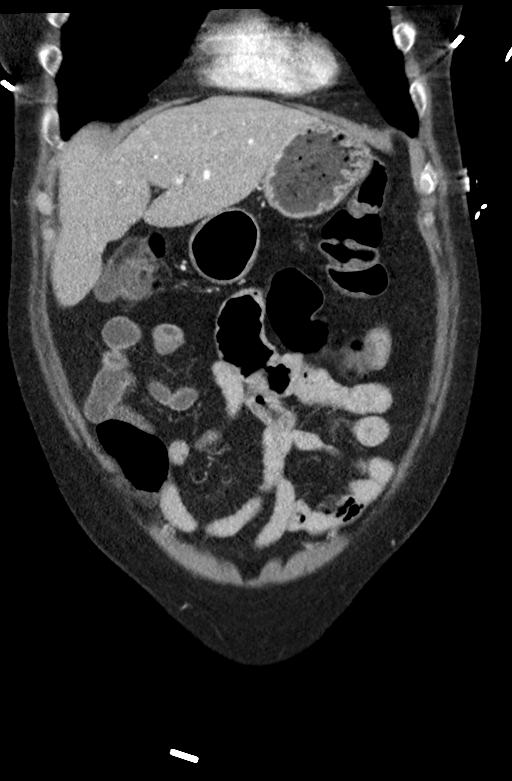
[im 39/87  soft-tissue]
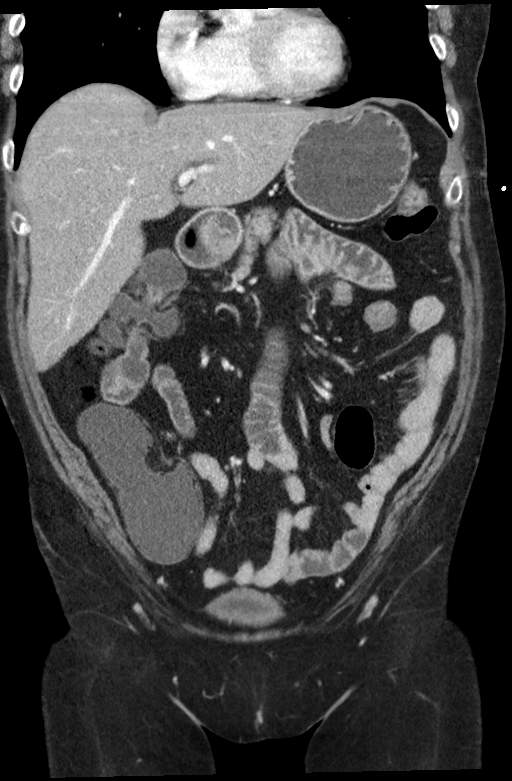
[im 48/87  soft-tissue]
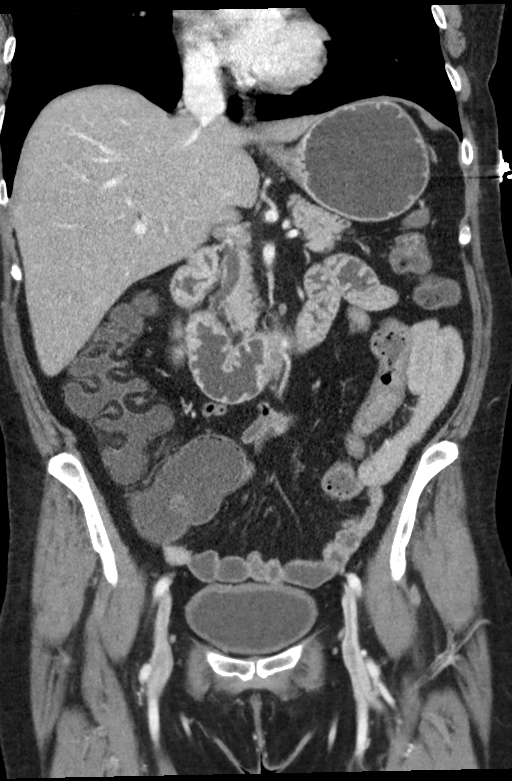

[15 of 46 positions shown; findings below may reference images not displayed]

FINDINGS: Lower chest: No acute pleural or parenchymal lung disease.

Hepatobiliary: No focal liver abnormality is seen. Status post
cholecystectomy. No biliary dilatation.

Pancreas: Unremarkable. No pancreatic ductal dilatation or
surrounding inflammatory changes.

Spleen: Normal in size without focal abnormality.

Adrenals/Urinary Tract: Peripelvic cyst upper pole left kidney.
Otherwise the kidneys enhance normally and symmetrically. No urinary
tract calculi or obstructive uropathy. Bladder is moderately
distended without filling defect or irregularity. The adrenals are
normal.

Stomach/Bowel: No bowel obstruction or ileus. Normal appendix right
lower quadrant. No bowel wall thickening or inflammatory change.

Vascular/Lymphatic: Aortic atherosclerosis. No enlarged abdominal or
pelvic lymph nodes.

Reproductive: Uterus and bilateral adnexa are unremarkable.

Other: No abdominal wall hernia or abnormality. No abdominopelvic
ascites.

Musculoskeletal: No acute or destructive bony lesions. There is
asymmetric sclerosis of the left sacroiliac joint compatible with
sacroiliitis. Reconstructed images demonstrate no additional
findings.
IMPRESSION: 1. No acute intra-abdominal or intrapelvic process. Normal appendix.
2. Left-sided sacroiliitis.
3. Aortic Atherosclerosis ([id number]).

## 2021-03-18 IMAGING — DX DG PELVIS 1-2V
1 series · 1 of 1 positions shown · non-contrast
Comparison: None.

CLINICAL DATA: Fall: Patient's husband landed on top

EXAM:
PELVIS - 1-2 VIEW

[pelvis ap]
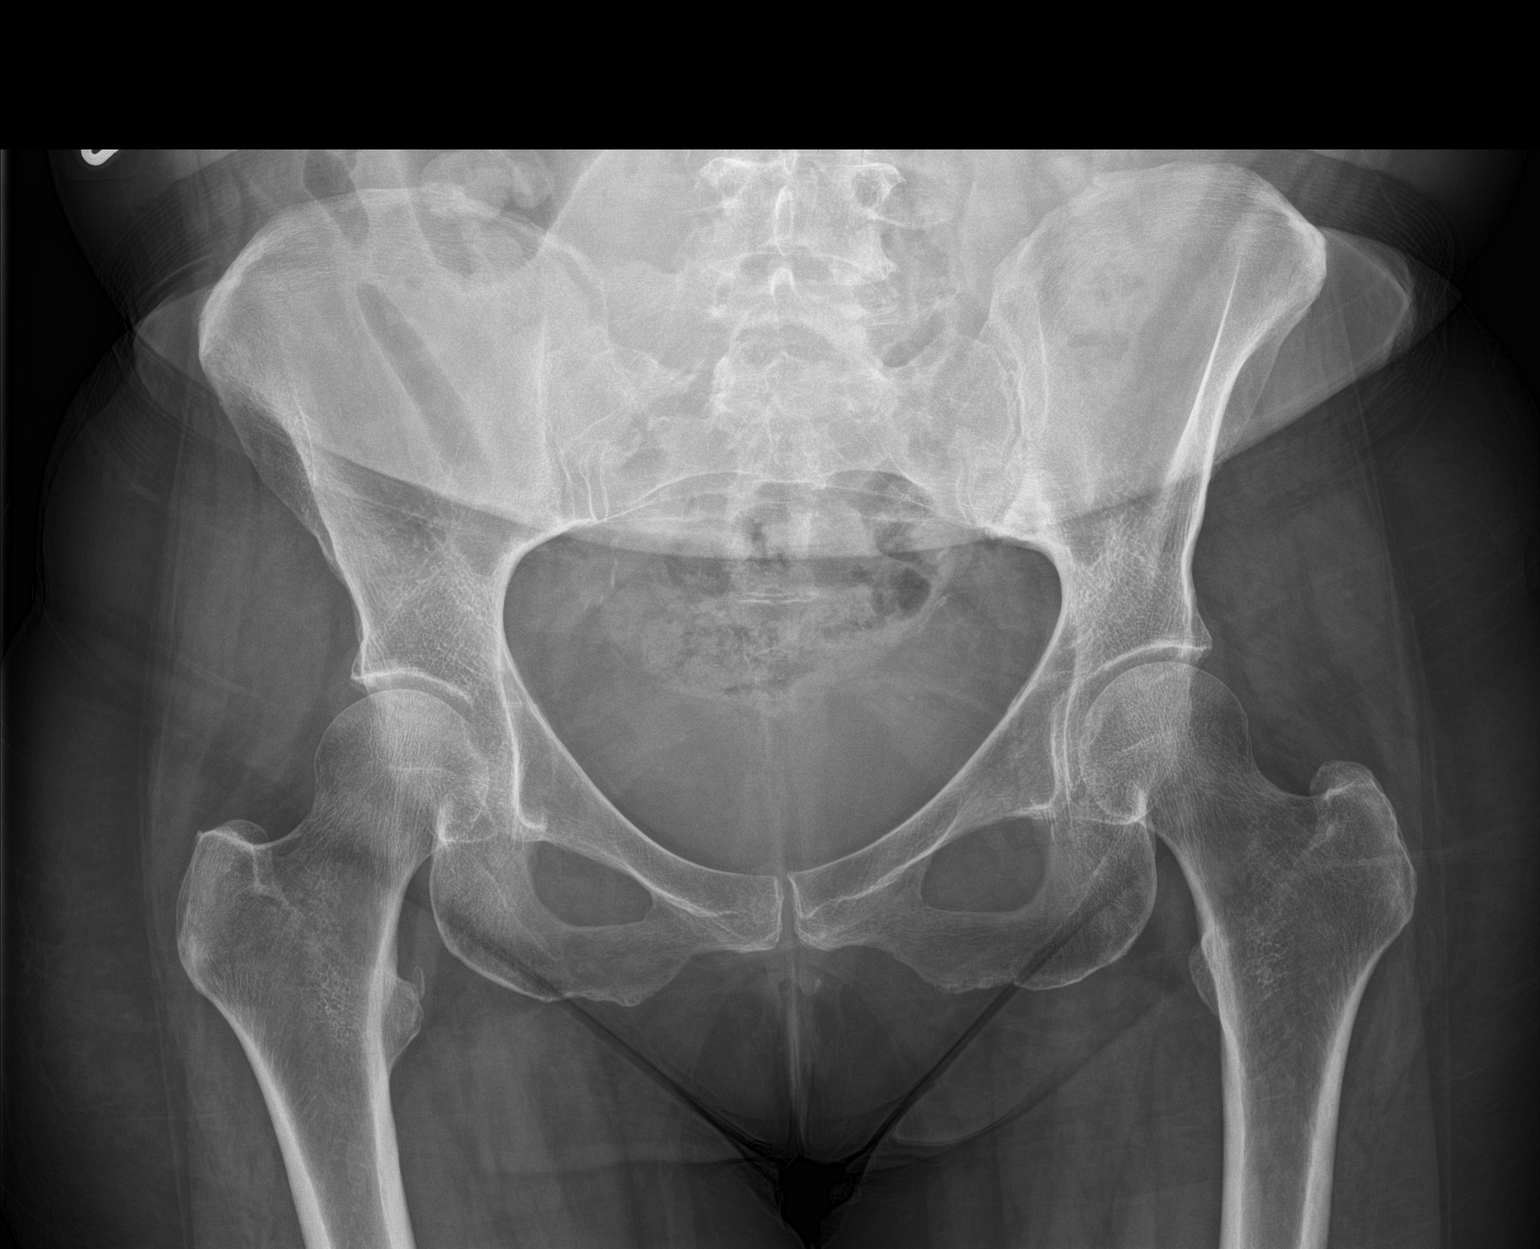

[1 of 1 positions shown; findings below may reference images not displayed]

FINDINGS: Bones of the pelvis appear intact and congruent. Proximal femora are
intact and normally located. Some enthesopathic changes noted at the
right lesser trochanter. Transitional lumbosacral vertebrae is
present with a partially sacralized right transverse process.
Degenerative changes in the lower lumbar spine, hips and pelvis are
mild. Mild lateral right hip soft tissue thickening is noted,
possibly contusive change/swelling. Atherosclerotic calcifications
are noted in the soft tissues.
IMPRESSION: 1. No acute fracture or other acute osseous abnormality.
2. Mild lateral right hip soft tissue thickening, possibly contusive
change/swelling.

## 2024-09-19 DEATH — deceased
